# Patient Record
Sex: Male | Born: 1960 | Race: Black or African American | Hispanic: No | Marital: Married | Smoking: Current some day smoker
Health system: Southern US, Community
[De-identification: ages and names within clinical notes are randomized; demographics above are authoritative.]

## PROBLEM LIST (undated history)

## (undated) ENCOUNTER — Ambulatory Visit: Admission: EM

## (undated) DIAGNOSIS — E663 Overweight: Secondary | ICD-10-CM

## (undated) DIAGNOSIS — E119 Type 2 diabetes mellitus without complications: Secondary | ICD-10-CM

## (undated) DIAGNOSIS — S83249A Other tear of medial meniscus, current injury, unspecified knee, initial encounter: Secondary | ICD-10-CM

## (undated) DIAGNOSIS — R42 Dizziness and giddiness: Secondary | ICD-10-CM

## (undated) DIAGNOSIS — G4730 Sleep apnea, unspecified: Secondary | ICD-10-CM

## (undated) DIAGNOSIS — I10 Essential (primary) hypertension: Secondary | ICD-10-CM

## (undated) DIAGNOSIS — E785 Hyperlipidemia, unspecified: Secondary | ICD-10-CM

## (undated) DIAGNOSIS — M109 Gout, unspecified: Secondary | ICD-10-CM

## (undated) DIAGNOSIS — Z9189 Other specified personal risk factors, not elsewhere classified: Secondary | ICD-10-CM

## (undated) HISTORY — DX: Essential (primary) hypertension: I10

## (undated) HISTORY — DX: Other tear of medial meniscus, current injury, unspecified knee, initial encounter: S83.249A

## (undated) HISTORY — PX: COLONOSCOPY: SHX174

## (undated) HISTORY — DX: Overweight: E66.3

## (undated) HISTORY — DX: Type 2 diabetes mellitus without complications: E11.9

## (undated) HISTORY — DX: Hyperlipidemia, unspecified: E78.5

## (undated) HISTORY — DX: Other specified personal risk factors, not elsewhere classified: Z91.89

## (undated) HISTORY — DX: Sleep apnea, unspecified: G47.30

## (undated) HISTORY — DX: Dizziness and giddiness: R42

## (undated) HISTORY — DX: Gout, unspecified: M10.9

---

## 2006-04-23 ENCOUNTER — Ambulatory Visit: Payer: Self-pay | Admitting: General Practice

## 2008-06-24 ENCOUNTER — Ambulatory Visit: Payer: Self-pay | Admitting: General Practice

## 2008-07-08 ENCOUNTER — Ambulatory Visit: Payer: Self-pay | Admitting: General Practice

## 2010-03-28 ENCOUNTER — Ambulatory Visit: Payer: Self-pay | Admitting: Unknown Physician Specialty

## 2010-03-28 LAB — HM COLONOSCOPY

## 2012-06-06 ENCOUNTER — Ambulatory Visit: Payer: Self-pay | Admitting: General Practice

## 2012-06-09 ENCOUNTER — Ambulatory Visit: Payer: Self-pay | Admitting: General Practice

## 2016-10-24 LAB — PSA: PSA: 3.1

## 2016-10-24 LAB — LIPID PANEL
Cholesterol: 183 (ref 0–200)
HDL: 37 (ref 35–70)
LDL Cholesterol: 129
Triglycerides: 84 (ref 40–160)

## 2016-10-24 LAB — HEMOGLOBIN A1C: Hemoglobin A1C: 6.8

## 2017-08-01 LAB — LIPID PANEL
Cholesterol: 188 (ref 0–200)
HDL: 38 (ref 35–70)
LDL Cholesterol: 117
Triglycerides: 165 — AB (ref 40–160)

## 2017-08-01 LAB — HEMOGLOBIN A1C: Hemoglobin A1C: 7.3

## 2017-11-05 LAB — LIPID PANEL
Cholesterol: 188 (ref 0–200)
HDL: 36 (ref 35–70)
LDL Cholesterol: 122
Triglycerides: 150 (ref 40–160)

## 2017-11-05 LAB — PSA: PSA: 3.2

## 2017-11-05 LAB — HEMOGLOBIN A1C: Hemoglobin A1C: 7.5

## 2018-07-01 LAB — TSH
TSH: 1.22
TSH: 2.05

## 2018-07-01 LAB — T4
T4, Total: 4.1
T4, Total: 4.1

## 2018-07-01 LAB — T3 UPTAKE
T3 Uptake: 28
T3 Uptake: 30

## 2018-07-01 LAB — FREE THYROXINE INDEX
Free Thyroxine Index: 1.1
Free Thyroxine Index: 1.2

## 2018-07-02 ENCOUNTER — Ambulatory Visit: Payer: Self-pay | Admitting: Internal Medicine

## 2018-07-02 ENCOUNTER — Encounter: Payer: Self-pay | Admitting: Internal Medicine

## 2018-07-02 ENCOUNTER — Other Ambulatory Visit: Payer: Self-pay

## 2018-07-02 ENCOUNTER — Other Ambulatory Visit: Payer: Self-pay | Admitting: Hematology

## 2018-07-02 VITALS — BP 126/97 | HR 76 | Temp 97.8°F | Resp 16 | Ht 68.0 in | Wt 179.0 lb

## 2018-07-02 DIAGNOSIS — E663 Overweight: Secondary | ICD-10-CM

## 2018-07-02 DIAGNOSIS — N182 Chronic kidney disease, stage 2 (mild): Secondary | ICD-10-CM

## 2018-07-02 DIAGNOSIS — I10 Essential (primary) hypertension: Secondary | ICD-10-CM

## 2018-07-02 DIAGNOSIS — K4090 Unilateral inguinal hernia, without obstruction or gangrene, not specified as recurrent: Secondary | ICD-10-CM

## 2018-07-02 DIAGNOSIS — E119 Type 2 diabetes mellitus without complications: Secondary | ICD-10-CM

## 2018-07-02 NOTE — Progress Notes (Signed)
S - Patient with a h/o NIDDM who presents with swelling in his left groin area. Was moving furniture and lifting some the day prior to him noticing this swelling, not painful and no pain or swelling into his left testicle. He notes when he has to urinate, it feels more swollen and then after, seems to lessen. No pain with urination. No h/o hernia Has been feeling well otherwise with no complaints, continues to watch his diet and has been losing weight successfully, waist down from 36 to 33. Losing weight intentionally. Taking the medicines regularly On checks at home of his BP they have been in the 120's over mid to high 80's. tob use - occas cigar   Current Outpatient Medications on File Prior to Visit  Medication Sig Dispense Refill  . allopurinol (ZYLOPRIM) 100 MG tablet Take 300 mg by mouth daily.    Marland Kitchen amLODipine (NORVASC) 10 MG tablet Take 1 tablet by mouth daily.    Marland Kitchen atorvastatin (LIPITOR) 10 MG tablet Take 1 tablet by mouth daily.    . bisoprolol (ZEBETA) 10 MG tablet Take 10 mg by mouth daily.    . metFORMIN (GLUCOPHAGE-XR) 500 MG 24 hr tablet Take 1 tablet by mouth daily.     No current facility-administered medications on file prior to visit.      No Known Allergies   O - NAD, masked  BP (!) 126/97 (BP Location: Right Arm, Patient Position: Sitting, Cuff Size: Large)   Pulse 76   Temp 97.8 F (36.6 C) (Oral)   Resp 16   Ht 5\' 8"  (1.727 m)   Wt 179 lb (81.2 kg)   SpO2 97%   BMI 27.22 kg/m    Recheck BP with machine - 121/91 on the left  HEENT - sclera anicteric, + glasses Car - RRR  Abd - obese, NT in lower quadrants,  Left suprapubic region with mild swelling, NT to palpate GU - mild inguinal hernia on checking canal and no tenderness palpating the left testicle, right canal without gap and no right suprapubic sweliling Neuro - Affect not flat, approp with conversation  Last labs reviewed, mild CKD noted from 10/2017 results and last A1c was down to 6.9 in March,  2020.   A/P  1. Likely Inguinal hernia concern - no pain noted, and educated on hernias  Will schedule an U/S to help further evaluate and f/u after  If has more swelling and at any time sever pain in this region, needs to be seen emergently and explained why and he understood.   2. NIDDM - controlled, expect with weight decrease, better control   Continue current medication and recheck labs (A1C and BMP planned before f/u). May need to decrease metformin pending recheck of labs  3. Mild CKD - last creatine 1.31  Will recheck BMP   4. Overweight - losing weight successfully in recent past and BMI down to 27.22  Congratulated on success with losing weight, would like to check his A1C again and also a BMP and can schedule before f/u planned after his ultrasound is done.   5. HTN - BP up some today, has been ok at home on recent checks noted  Continue to monitor and with his meds presently to control.  Will get another check on f/u after his ultrasound as well and review his home BP checks at that time

## 2018-07-10 ENCOUNTER — Other Ambulatory Visit: Payer: Self-pay

## 2018-07-10 ENCOUNTER — Ambulatory Visit
Admission: RE | Admit: 2018-07-10 | Discharge: 2018-07-10 | Disposition: A | Discharge status: Home / Self Care | Payer: BC Managed Care – PPO | Source: Ambulatory Visit | Attending: Internal Medicine | Admitting: Internal Medicine

## 2018-07-10 ENCOUNTER — Other Ambulatory Visit: Payer: Managed Care, Other (non HMO)

## 2018-07-10 DIAGNOSIS — K4090 Unilateral inguinal hernia, without obstruction or gangrene, not specified as recurrent: Secondary | ICD-10-CM

## 2018-07-10 NOTE — Addendum Note (Signed)
Addended by: Lebron Conners D on: 07/10/2018 04:36 PM   Modules accepted: Orders

## 2018-07-10 NOTE — Progress Notes (Signed)
Pt would like Mayer Masker, MD for the consult

## 2018-07-11 LAB — BASIC METABOLIC PANEL
BUN/Creatinine Ratio: 15 (ref 9–20)
BUN: 16 mg/dL (ref 6–24)
CO2: 20 mmol/L (ref 20–29)
Calcium: 8.8 mg/dL (ref 8.7–10.2)
Chloride: 107 mmol/L — ABNORMAL HIGH (ref 96–106)
Creatinine, Ser: 1.06 mg/dL (ref 0.76–1.27)
GFR calc Af Amer: 89 mL/min/{1.73_m2} (ref >59)
GFR calc non Af Amer: 77 mL/min/{1.73_m2} (ref >59)
Glucose: 95 mg/dL (ref 65–99)
Potassium: 3.8 mmol/L (ref 3.5–5.2)
Sodium: 142 mmol/L (ref 134–144)

## 2018-07-11 LAB — HGB A1C W/O EAG: Hgb A1c MFr Bld: 5.7 % — ABNORMAL HIGH (ref 4.8–5.6)

## 2018-07-15 ENCOUNTER — Telehealth: Payer: Self-pay

## 2018-07-15 ENCOUNTER — Encounter: Payer: Self-pay | Admitting: Internal Medicine

## 2018-07-15 ENCOUNTER — Other Ambulatory Visit: Payer: Self-pay

## 2018-07-15 ENCOUNTER — Ambulatory Visit: Payer: Managed Care, Other (non HMO) | Admitting: Internal Medicine

## 2018-07-15 VITALS — BP 118/82 | HR 71 | Temp 96.9°F | Resp 14 | Ht 68.0 in | Wt 174.0 lb

## 2018-07-15 DIAGNOSIS — E119 Type 2 diabetes mellitus without complications: Secondary | ICD-10-CM

## 2018-07-15 DIAGNOSIS — I10 Essential (primary) hypertension: Secondary | ICD-10-CM

## 2018-07-15 DIAGNOSIS — E663 Overweight: Secondary | ICD-10-CM

## 2018-07-15 DIAGNOSIS — K4090 Unilateral inguinal hernia, without obstruction or gangrene, not specified as recurrent: Secondary | ICD-10-CM

## 2018-07-15 NOTE — Telephone Encounter (Signed)
Called to advise pt of appt with Dr Bary Castilla at 10:30am 07/16/2018, pt given contact information for clinic and advised to call once he gets there do not go into clinic without calling.

## 2018-07-15 NOTE — Progress Notes (Signed)
S - Patient with a h/o NIDDM, HTN  who presents for f/u after I saw the end of June for swelling in his left groin area with an U/S obtained and patient was called with that result and a surgical referral ordered due to concern with bowel loop involvement with the hernia. Still not painful and no pain or swelling into his left testicle. No pain with urination.   Has been feeling great, continues to watch his diet and has been losing weight successfully, waist down from 36 to 33 noted last visit. Losing weight intentionally. Taking the medicines regularly On checks at home of his BP they have been in the 120's over mid to high 80's. tob use - occas cigar  Current Outpatient Medications on File Prior to Visit  Medication Sig Dispense Refill  . allopurinol (ZYLOPRIM) 100 MG tablet Take 300 mg by mouth daily.    Marland Kitchen amLODipine (NORVASC) 10 MG tablet Take 1 tablet by mouth daily.    Marland Kitchen atorvastatin (LIPITOR) 10 MG tablet Take 1 tablet by mouth daily.    . bisoprolol (ZEBETA) 10 MG tablet Take 10 mg by mouth daily.    . metFORMIN (GLUCOPHAGE-XR) 500 MG 24 hr tablet Take 1 tablet by mouth daily.     No current facility-administered medications on file prior to visit.    No Known Allergies  O - NAD, masked  BP 118/82 (BP Location: Right Arm, Patient Position: Sitting, Cuff Size: Large)   Pulse 71   Temp (!) 96.9 F (36.1 C) (Oral)   Resp 14   Ht 5\' 8"  (1.727 m)   Wt 174 lb (78.9 kg)   SpO2 99%   BMI 26.46 kg/m   (BP 126/97, recheck 91 diastolic last visit)   HEENT - sclera anicteric, + glasses Car - RRR  Pulm - CTA  Ext - no edema Neuro - Affect not flat, approp with conversation  Last labs reviewed, A1C - 5.7 (decreased from prior), Cr normal (higher in past)  A/P  1. Inguinal hernia concern  Referral to surgery and await input, and again educated today in office    2. NIDDM - controlled, expected with weight decrease, better control and labs confirm that  Continue  current medication and recheck labs again in 3 months planned (A1C). May need to decrease/stop metformin pending recheck of labs noted.  3. Mild CKD - creatine normal on recent check (prior creatine 1.31)  Continue to monitor and emphasized staying well hydrated presently  4. Overweight - losing weight successfully in recent past and BMI down to 26.46  Congratulated on success with losing weight, and continue with diet and exercise increase as doing. He notes feels great.  5. HTN - BP better today, has been ok at home on recent checks noted  Continue to monitor and with his meds presently to control.   F/u in 3 months, sooner prn

## 2018-07-16 ENCOUNTER — Encounter: Payer: Self-pay | Admitting: General Surgery

## 2018-07-16 ENCOUNTER — Other Ambulatory Visit: Payer: Self-pay | Admitting: General Surgery

## 2018-07-16 ENCOUNTER — Ambulatory Visit (INDEPENDENT_AMBULATORY_CARE_PROVIDER_SITE_OTHER): Payer: 59 | Admitting: General Surgery

## 2018-07-16 ENCOUNTER — Other Ambulatory Visit: Payer: Self-pay

## 2018-07-16 VITALS — BP 124/81 | HR 74 | Temp 97.7°F | Ht 68.0 in | Wt 179.0 lb

## 2018-07-16 DIAGNOSIS — K4090 Unilateral inguinal hernia, without obstruction or gangrene, not specified as recurrent: Secondary | ICD-10-CM

## 2018-07-16 NOTE — Patient Instructions (Addendum)
Patient's surgery to be scheduled for 07/24/27 at [facility] with Dr. Bary Castilla.   The patient is aware to have COVID-19 testing done on 07/19/18 at the [facility] drive thru ([address] [place]) between 8:00 am and 10:30 am. He is aware to isolate after, have no visitors, wash hands frequently, and avoid touching face.   The patient is aware he will be contacted by the [facility] to complete a phone interview sometime in the near future.  The patient is aware to call the day before surgery on 07/23/18 to get his arrival time.  Patient aware to be NPO after midnight and have a driver.   He is aware to check in at the [facility] entrance where he/she will be screened for the coronavirus and then sent to Same Day Surgery.   Patient aware that he may have no visitors and driver will need to wait in the car due to COVID-19 restrictions.   The patient verbalizes understanding of the above.   The patient is aware to call the office should he have further questions.      Inguinal Hernia, Adult An inguinal hernia is when fat or your intestines push through a weak spot in a muscle where your leg meets your lower belly (groin). This causes a rounded lump (bulge). This kind of hernia could also be:  In your scrotum, if you are male.  In folds of skin around your vagina, if you are male. There are three types of inguinal hernias. These include:  Hernias that can be pushed back into the belly (are reducible). This type rarely causes pain.  Hernias that cannot be pushed back into the belly (are incarcerated).  Hernias that cannot be pushed back into the belly and lose their blood supply (are strangulated). This type needs emergency surgery. If you do not have symptoms, you may not need treatment. If you have symptoms or a large hernia, you may need surgery. Follow these instructions at home: Lifestyle  Do these things if told by your doctor so you do not  have trouble pooping (constipation): ? Drink enough fluid to keep your pee (urine) pale yellow. ? Eat foods that have a lot of fiber. These include fresh fruits and vegetables, whole grains, and beans. ? Limit foods that are high in fat and processed sugars. These include foods that are fried or sweet. ? Take medicine for trouble pooping.  Avoid lifting heavy objects.  Avoid standing for long amounts of time.  Do not use any products that contain nicotine or tobacco. These include cigarettes and e-cigarettes. If you need help quitting, ask your doctor.  Stay at a healthy weight. General instructions  You may try to push your hernia in by very gently pressing on it when you are lying down. Do not try to force the bulge back in if it will not push in easily.  Watch your hernia for any changes in shape, size, or color. Tell your doctor if you see any changes.  Take over-the-counter and prescription medicines only as told by your doctor.  Keep all follow-up visits as told by your doctor. This is important. Contact a doctor if:  You have a fever.  You have new symptoms.  Your symptoms get worse. Get help right away if:  The area where your leg meets your lower belly has: ? Pain that gets worse suddenly. ? A bulge that gets bigger suddenly, and it does not get smaller after that. ?  A bulge that turns red or purple. ? A bulge that is painful when you touch it.  You are a man, and your scrotum: ? Suddenly feels painful. ? Suddenly changes in size.  You cannot push the hernia in by very gently pressing on it when you are lying down. Do not try to force the bulge back in if it will not push in easily.  You feel sick to your stomach (nauseous), and that feeling does not go away.  You throw up (vomit), and that keeps happening.  You have a fast heartbeat.  You cannot poop (have a bowel movement) or pass gas. These symptoms may be an emergency. Do not wait to see if the symptoms  will go away. Get medical help right away. Call your local emergency services (911 in the [place] Summary  An inguinal hernia is when fat or your intestines push through a weak spot in a muscle where your leg meets your lower belly (groin). This causes a rounded lump (bulge).  If you do not have symptoms, you may not need treatment. If you have symptoms or a large hernia, you may need surgery.  Avoid lifting heavy objects. Also avoid standing for long amounts of time.  Do not try to force the bulge back in if it will not push in easily. This information is not intended to replace advice given to you by your health care provider. Make sure you discuss any questions you have with your health care provider. Document Released: 01/26/2006 Document Revised: 01/27/2017 Document Reviewed: 09/27/2016 Elsevier Patient Education  2020 [employer].

## 2018-07-16 NOTE — Progress Notes (Signed)
Patient ID: Tom Edwards, male   DOB: Nov 15, 1960, 58 y.o.   MRN: [id number]  Chief Complaint  Patient presents with  . Hernia     left inguinal hernia    HPI Tom Edwards is a 58 y.o. male.  Here for evaluation of left inguinal hernia. He states he noticed some left groin swelling about 2-3 weeks ago. Denies pain. No GI issues. He states he helped his brother lift some furniture about one month ago. The patient is scheduled to be married later this summer assuming a suitable venue can be identified during the time of the pandemic.  HPI  Past Medical History:  Diagnosis Date  . Acute medial meniscus tear   . DM (diabetes mellitus), type 2 ([place])   . Gout   . Hyperlipemia   . Hypertension   . Over weight   . Sedentary lifestyle   . Sleep apnea   . Vertigo     No past surgical history on file.  Family History  Problem Relation Age of Onset  . Diabetes Mother   . Cerebrovascular Accident Mother   . Prostate cancer Father   . Hypertension Father   . Diabetes Brother     Social History Social History   Tobacco Use  . Smoking status: Current Some Day Smoker    Types: Cigars  . Smokeless tobacco: Never Used  Substance Use Topics  . Alcohol use: Yes    Alcohol/week: 7.0 standard drinks    Types: 7 Cans of beer per week  . Drug use: Never    No Known Allergies  Current Outpatient Medications  Medication Sig Dispense Refill  . allopurinol (ZYLOPRIM) 100 MG tablet Take 300 mg by mouth daily.    Marland Kitchen amLODipine (NORVASC) 10 MG tablet Take 1 tablet by mouth daily.    Marland Kitchen atorvastatin (LIPITOR) 10 MG tablet Take 1 tablet by mouth daily.    . bisoprolol (ZEBETA) 10 MG tablet Take 10 mg by mouth daily.    . metFORMIN (GLUCOPHAGE-XR) 500 MG 24 hr tablet Take 1 tablet by mouth daily.     No current facility-administered medications for this visit.     Review of Systems Review of Systems  Constitutional: Negative.   Respiratory: Negative.   Cardiovascular: Negative.      Blood pressure 124/81, pulse 74, temperature 97.7 F (36.5 C), temperature source Temporal, height 5\' 8"  (1.727 m), weight 179 lb (81.2 kg), SpO2 97 %.  Physical Exam Physical Exam Constitutional:      Appearance: He is well-developed.  HENT:     Mouth/Throat:     Pharynx: No oropharyngeal exudate.  Eyes:     General: No scleral icterus.    Conjunctiva/sclera: Conjunctivae normal.  Neck:     Musculoskeletal: Neck supple.  Cardiovascular:     Rate and Rhythm: Normal rate and regular rhythm.     Heart sounds: Normal heart sounds.  Pulmonary:     Effort: Pulmonary effort is normal.     Breath sounds: Normal breath sounds.  Abdominal:     General: Bowel sounds are normal.     Palpations: Abdomen is soft.     Hernia: A hernia is present. Hernia is present in the left inguinal area. There is no hernia in the right inguinal area.  Genitourinary:    Scrotum/Testes: Normal.    Skin:    General: Skin is warm and dry.  Neurological:     Mental Status: He is alert and oriented to person, place,  and time.  Psychiatric:        Behavior: Behavior normal.     Data Reviewed Laboratory studies dated July 10, 2018 showed a hemoglobin A1c of 5.7.  Improved from 7.58 months earlier.  Basic metabolic panel of the same day was unremarkable.  Creatinine of 1.06 with an estimated GFR of 89.  Chloride of 107.  Assessment Left inguinal hernia.  Plan   Hernia precautions and incarceration were discussed with the patient. If they develop symptoms of an incarcerated hernia, they were encouraged to seek prompt medical attention.  I have recommended repair of the hernia using mesh on an outpatient basis in the near future. The risk of infection was reviewed. The role of prosthetic mesh to minimize the risk of recurrence was reviewed.  The patient is aware to call back for any questions or new concerns.   The patient is aware to call back for any questions or new concerns. HPI, assessment,  plan and physical exam has been scribed under the direction and in the presence of Robert Bellow, MD. Karie Fetch, RN  I have completed the exam and reviewed the above documentation for accuracy and completeness.  I agree with the above.  Haematologist has been used and any errors in dictation or transcription are unintentional.  Hervey Ard, M.D., F.A.C.S. [facility] Gleason Falicity Sheets 07/16/2018, 8:52 PM

## 2018-07-18 ENCOUNTER — Encounter
Admission: RE | Admit: 2018-07-18 | Discharge: 2018-07-18 | Disposition: A | Discharge status: Home / Self Care | Payer: 59 | Source: Ambulatory Visit | Attending: General Surgery | Admitting: General Surgery

## 2018-07-18 ENCOUNTER — Other Ambulatory Visit: Payer: Self-pay

## 2018-07-18 NOTE — Patient Instructions (Signed)
Your procedure is scheduled on: Wed. 7/15 Report to Day Surgery. To find out your arrival time please call [phone] between 1PM - 3PM on Tues 7/14.  Remember: Instructions that are not followed completely may result in serious medical risk,  up to and including death, or upon the discretion of your surgeon and anesthesiologist your  surgery may need to be rescheduled.     _X__ 1. Do not eat food after midnight the night before your procedure.                 No gum chewing or hard candies. You may drink clear liquids up to 2 hours                 before you are scheduled to arrive for your surgery- DO not drink clear                 liquids within 2 hours of the start of your surgery.                 Clear Liquids include:  water, apple juice without pulp, clear carbohydrate                 drink such as Clearfast of Gatorade, Black Coffee or Tea (Do not add                 anything to coffee or tea).  __X__2.  On the morning of surgery brush your teeth with toothpaste and water, you                may rinse your mouth with mouthwash if you wish.  Do not swallow any toothpaste of mouthwash.     _X__ 3.  No Alcohol for 24 hours before or after surgery.   _X__ 4.  Do Not Smoke or use e-cigarettes For 24 Hours Prior to Your Surgery.                 Do not use any chewable tobacco products for at least 6 hours prior to                 surgery.  ____  5.  Bring all medications with you on the day of surgery if instructed.   __x__  6.  Notify your doctor if there is any change in your medical condition      (cold, fever, infections).     Do not wear jewelry, make-up, hairpins, clips or nail polish. Do not wear lotions, powders, or perfumes. You may wear deodorant. Do not shave 48 hours prior to surgery. Men may shave face and neck. Do not bring valuables to the hospital.    [facility] is not responsible for any belongings or valuables.  Contacts, dentures  or bridgework may not be worn into surgery. Leave your suitcase in the car. After surgery it may be brought to your room. For patients admitted to the hospital, discharge time is determined by your treatment team.   Patients discharged the day of surgery will not be allowed to drive home.   Please read over the following fact sheets that you were given:   __x__ Take these medicines the morning of surgery with A SIP OF WATER:    1. allopurinol (ZYLOPRIM) 100 MG tablet  2.bisoprolol (ZEBETA) 10 MG tablet   3.   4.  5.  6.  ____ Fleet Enema (as directed)   __x__ Use CHG Soap as directed  ____ Use inhalers on the day of surgery  __x__ Stop metformin 2 days prior to surgery Last dose on Sunday   ____ Take 1/2 of usual insulin dose the night before surgery. No insulin the morning          of surgery.   ____ Stop Coumadin/Plavix/aspirin on   __x__ Stop Anti-inflammatories No ibuprofen or Aleve or Aspirin until after surgery.  May take tylenol   ____ Stop supplements until after surgery.    ____ Bring C-Pap to the hospital.

## 2018-07-19 ENCOUNTER — Other Ambulatory Visit: Payer: Self-pay

## 2018-07-19 ENCOUNTER — Encounter
Admission: RE | Admit: 2018-07-19 | Discharge: 2018-07-19 | Disposition: A | Discharge status: Home / Self Care | Payer: 59 | Source: Ambulatory Visit | Attending: General Surgery | Admitting: General Surgery

## 2018-07-19 DIAGNOSIS — Z1159 Encounter for screening for other viral diseases: Secondary | ICD-10-CM | POA: Diagnosis not present

## 2018-07-19 DIAGNOSIS — I10 Essential (primary) hypertension: Secondary | ICD-10-CM | POA: Diagnosis not present

## 2018-07-19 DIAGNOSIS — Z01818 Encounter for other preprocedural examination: Secondary | ICD-10-CM | POA: Diagnosis present

## 2018-07-19 LAB — SARS CORONAVIRUS 2 (TAT 6-24 HRS): SARS Coronavirus 2: NEGATIVE

## 2018-07-24 ENCOUNTER — Encounter
Admission: RE | Disposition: A | Discharge status: Home / Self Care | Payer: Self-pay | Source: Home / Self Care | Attending: General Surgery

## 2018-07-24 ENCOUNTER — Ambulatory Visit: Payer: 59 | Admitting: Certified Registered"

## 2018-07-24 ENCOUNTER — Other Ambulatory Visit: Payer: Self-pay

## 2018-07-24 ENCOUNTER — Ambulatory Visit
Admission: RE | Admit: 2018-07-24 | Discharge: 2018-07-24 | Disposition: A | Discharge status: Home / Self Care | Payer: 59 | Attending: General Surgery | Admitting: General Surgery

## 2018-07-24 ENCOUNTER — Encounter: Payer: Self-pay | Admitting: *Deleted

## 2018-07-24 DIAGNOSIS — K4090 Unilateral inguinal hernia, without obstruction or gangrene, not specified as recurrent: Secondary | ICD-10-CM | POA: Diagnosis present

## 2018-07-24 DIAGNOSIS — Z6827 Body mass index (BMI) 27.0-27.9, adult: Secondary | ICD-10-CM | POA: Insufficient documentation

## 2018-07-24 DIAGNOSIS — E785 Hyperlipidemia, unspecified: Secondary | ICD-10-CM | POA: Insufficient documentation

## 2018-07-24 DIAGNOSIS — E1122 Type 2 diabetes mellitus with diabetic chronic kidney disease: Secondary | ICD-10-CM | POA: Insufficient documentation

## 2018-07-24 DIAGNOSIS — I129 Hypertensive chronic kidney disease with stage 1 through stage 4 chronic kidney disease, or unspecified chronic kidney disease: Secondary | ICD-10-CM | POA: Diagnosis not present

## 2018-07-24 DIAGNOSIS — G4730 Sleep apnea, unspecified: Secondary | ICD-10-CM | POA: Insufficient documentation

## 2018-07-24 DIAGNOSIS — E669 Obesity, unspecified: Secondary | ICD-10-CM | POA: Insufficient documentation

## 2018-07-24 DIAGNOSIS — N182 Chronic kidney disease, stage 2 (mild): Secondary | ICD-10-CM | POA: Diagnosis not present

## 2018-07-24 DIAGNOSIS — F17290 Nicotine dependence, other tobacco product, uncomplicated: Secondary | ICD-10-CM | POA: Diagnosis not present

## 2018-07-24 DIAGNOSIS — Z7984 Long term (current) use of oral hypoglycemic drugs: Secondary | ICD-10-CM | POA: Insufficient documentation

## 2018-07-24 HISTORY — PX: INGUINAL HERNIA REPAIR: SHX194

## 2018-07-24 LAB — GLUCOSE, CAPILLARY: Glucose-Capillary: 111 mg/dL — ABNORMAL HIGH (ref 70–99)

## 2018-07-24 SURGERY — REPAIR, HERNIA, INGUINAL, ADULT
Anesthesia: General | Laterality: Left

## 2018-07-24 MED ORDER — BUPIVACAINE-EPINEPHRINE (PF) 0.5% -1:200000 IJ SOLN
INTRAMUSCULAR | Status: DC | PRN
  Administered 2018-07-24: 20 mL
  Administered 2018-07-24: 10 mL

## 2018-07-24 MED ORDER — DEXAMETHASONE SODIUM PHOSPHATE 10 MG/ML IJ SOLN
INTRAMUSCULAR | Status: DC | PRN
  Administered 2018-07-24: 10 mg via INTRAVENOUS

## 2018-07-24 MED ORDER — CEFAZOLIN SODIUM-DEXTROSE 2-4 GM/100ML-% IV SOLN
2.0000 g | INTRAVENOUS | Status: AC
  Administered 2018-07-24: 2 g via INTRAVENOUS

## 2018-07-24 MED ORDER — FAMOTIDINE 20 MG PO TABS
20.0000 mg | ORAL_TABLET | Freq: Once | ORAL | Status: AC
  Administered 2018-07-24: 11:00:00 20 mg via ORAL

## 2018-07-24 MED ORDER — OXYCODONE HCL 5 MG/5ML PO SOLN: 5.0000 mg | Freq: Once | ORAL | Status: DC | PRN

## 2018-07-24 MED ORDER — LIDOCAINE HCL (CARDIAC) PF 100 MG/5ML IV SOSY
PREFILLED_SYRINGE | INTRAVENOUS | Status: DC | PRN
  Administered 2018-07-24: 100 mg via INTRAVENOUS

## 2018-07-24 MED ORDER — PHENYLEPHRINE HCL (PRESSORS) 10 MG/ML IV SOLN
INTRAVENOUS | Status: DC | PRN
  Administered 2018-07-24 (×2): 200 ug via INTRAVENOUS

## 2018-07-24 MED ORDER — CEFAZOLIN SODIUM-DEXTROSE 2-4 GM/100ML-% IV SOLN
INTRAVENOUS | Status: AC
  Filled 2018-07-24: qty 100

## 2018-07-24 MED ORDER — FENTANYL CITRATE (PF) 100 MCG/2ML IJ SOLN
INTRAMUSCULAR | Status: DC | PRN
  Administered 2018-07-24 (×2): 50 ug via INTRAVENOUS

## 2018-07-24 MED ORDER — MEPERIDINE HCL 50 MG/ML IJ SOLN: 6.2500 mg | INTRAMUSCULAR | Status: DC | PRN

## 2018-07-24 MED ORDER — OXYCODONE HCL 5 MG PO TABS: 5.0000 mg | ORAL_TABLET | Freq: Once | ORAL | Status: DC | PRN

## 2018-07-24 MED ORDER — GABAPENTIN 300 MG PO CAPS
ORAL_CAPSULE | ORAL | Status: AC
  Administered 2018-07-24: 300 mg via ORAL
  Filled 2018-07-24: qty 1

## 2018-07-24 MED ORDER — FENTANYL CITRATE (PF) 100 MCG/2ML IJ SOLN: 25.0000 ug | INTRAMUSCULAR | Status: DC | PRN

## 2018-07-24 MED ORDER — GABAPENTIN 300 MG PO CAPS
300.0000 mg | ORAL_CAPSULE | ORAL | Status: AC
  Administered 2018-07-24: 11:00:00 300 mg via ORAL

## 2018-07-24 MED ORDER — ONDANSETRON HCL 4 MG/2ML IJ SOLN
INTRAMUSCULAR | Status: DC | PRN
  Administered 2018-07-24: 4 mg via INTRAVENOUS

## 2018-07-24 MED ORDER — PROPOFOL 10 MG/ML IV BOLUS
INTRAVENOUS | Status: AC
  Filled 2018-07-24: qty 20

## 2018-07-24 MED ORDER — FENTANYL CITRATE (PF) 100 MCG/2ML IJ SOLN
INTRAMUSCULAR | Status: AC
  Filled 2018-07-24: qty 2

## 2018-07-24 MED ORDER — LACTATED RINGERS IV SOLN
INTRAVENOUS | Status: DC | PRN
  Administered 2018-07-24: 12:00:00 via INTRAVENOUS

## 2018-07-24 MED ORDER — EPHEDRINE SULFATE 50 MG/ML IJ SOLN
INTRAMUSCULAR | Status: DC | PRN
  Administered 2018-07-24 (×2): 10 mg via INTRAVENOUS

## 2018-07-24 MED ORDER — FAMOTIDINE 20 MG PO TABS
ORAL_TABLET | ORAL | Status: AC
  Administered 2018-07-24: 20 mg via ORAL
  Filled 2018-07-24: qty 1

## 2018-07-24 MED ORDER — ACETAMINOPHEN 10 MG/ML IV SOLN
INTRAVENOUS | Status: AC
  Filled 2018-07-24: qty 100

## 2018-07-24 MED ORDER — MIDAZOLAM HCL 2 MG/2ML IJ SOLN
INTRAMUSCULAR | Status: AC
  Filled 2018-07-24: qty 2

## 2018-07-24 MED ORDER — PROPOFOL 10 MG/ML IV BOLUS
INTRAVENOUS | Status: DC | PRN
  Administered 2018-07-24: 160 mg via INTRAVENOUS

## 2018-07-24 MED ORDER — SODIUM CHLORIDE 0.9 % IV SOLN
INTRAVENOUS | Status: DC
  Administered 2018-07-24: 11:00:00 via INTRAVENOUS

## 2018-07-24 MED ORDER — BUPIVACAINE-EPINEPHRINE (PF) 0.5% -1:200000 IJ SOLN
INTRAMUSCULAR | Status: AC
  Filled 2018-07-24: qty 30

## 2018-07-24 MED ORDER — PROMETHAZINE HCL 25 MG/ML IJ SOLN: 6.2500 mg | INTRAMUSCULAR | Status: DC | PRN

## 2018-07-24 MED ORDER — HYDROCODONE-ACETAMINOPHEN 5-325 MG PO TABS: 1.0000 | ORAL_TABLET | ORAL | 0 refills | Status: DC | PRN

## 2018-07-24 MED ORDER — KETOROLAC TROMETHAMINE 30 MG/ML IJ SOLN
INTRAMUSCULAR | Status: DC | PRN
  Administered 2018-07-24: 30 mg

## 2018-07-24 MED ORDER — ACETAMINOPHEN 10 MG/ML IV SOLN
INTRAVENOUS | Status: DC | PRN
  Administered 2018-07-24: 1000 mg via INTRAVENOUS

## 2018-07-24 SURGICAL SUPPLY — 34 items
BLADE SURG 15 STRL SS SAFETY (BLADE) ×4 IMPLANT
CANISTER SUCT 1200ML W/VALVE (MISCELLANEOUS) ×2 IMPLANT
CHLORAPREP W/TINT 26 (MISCELLANEOUS) ×2 IMPLANT
COVER WAND RF STERILE (DRAPES) IMPLANT
DECANTER SPIKE VIAL GLASS SM (MISCELLANEOUS) IMPLANT
DRAIN PENROSE 1/4X12 LTX (DRAIN) ×2 IMPLANT
DRAPE LAPAROTOMY 100X77 ABD (DRAPES) ×2 IMPLANT
DRSG TEGADERM 4X4.75 (GAUZE/BANDAGES/DRESSINGS) ×2 IMPLANT
DRSG TELFA 4X3 1S NADH ST (GAUZE/BANDAGES/DRESSINGS) ×2 IMPLANT
ELECT REM PT RETURN 9FT ADLT (ELECTROSURGICAL) ×2
ELECTRODE REM PT RTRN 9FT ADLT (ELECTROSURGICAL) ×1 IMPLANT
GLOVE BIO SURGEON STRL SZ7.5 (GLOVE) ×6 IMPLANT
GLOVE INDICATOR 8.0 STRL GRN (GLOVE) ×6 IMPLANT
GOWN STRL REUS W/ TWL LRG LVL3 (GOWN DISPOSABLE) ×3 IMPLANT
GOWN STRL REUS W/TWL LRG LVL3 (GOWN DISPOSABLE) ×3
KIT TURNOVER KIT A (KITS) ×2 IMPLANT
LABEL OR SOLS (LABEL) IMPLANT
MESH HERNIA SYS ULTRAPRO LRG (Mesh General) ×2 IMPLANT
NEEDLE HYPO 22GX1.5 SAFETY (NEEDLE) ×4 IMPLANT
PACK BASIN MINOR ARMC (MISCELLANEOUS) ×2 IMPLANT
STRIP CLOSURE SKIN 1/2X4 (GAUZE/BANDAGES/DRESSINGS) IMPLANT
SUT PDS AB 0 CT1 27 (SUTURE) IMPLANT
SUT SURGILON 0 BLK (SUTURE) ×2 IMPLANT
SUT VIC AB 2-0 SH 27 (SUTURE) ×1
SUT VIC AB 2-0 SH 27XBRD (SUTURE) ×1 IMPLANT
SUT VIC AB 3-0 54X BRD REEL (SUTURE) IMPLANT
SUT VIC AB 3-0 BRD 54 (SUTURE)
SUT VIC AB 3-0 SH 27 (SUTURE) ×1
SUT VIC AB 3-0 SH 27X BRD (SUTURE) ×1 IMPLANT
SUT VIC AB 4-0 FS2 27 (SUTURE) ×2 IMPLANT
SWABSTK COMLB BENZOIN TINCTURE (MISCELLANEOUS) ×2 IMPLANT
SYR 10ML LL (SYRINGE) ×2 IMPLANT
SYR 3ML LL SCALE MARK (SYRINGE) ×2 IMPLANT
TAPE STRIPS DRAPE STRL (GAUZE/BANDAGES/DRESSINGS) ×2 IMPLANT

## 2018-07-24 NOTE — Anesthesia Procedure Notes (Signed)
Procedure Name: LMA Insertion Date/Time: 07/24/2018 11:51 AM Performed by: Philbert Riser, CRNA Pre-anesthesia Checklist: Patient identified, Emergency Drugs available, Suction available, Patient being monitored and Timeout performed Patient Re-evaluated:Patient Re-evaluated prior to induction Oxygen Delivery Method: Circle system utilized and Simple face mask Preoxygenation: Pre-oxygenation with 100% oxygen Induction Type: IV induction Ventilation: Mask ventilation without difficulty LMA: LMA inserted LMA Size: 4.0 Dental Injury: Teeth and Oropharynx as per pre-operative assessment

## 2018-07-24 NOTE — H&P (Signed)
No change in clinical history or exam.  For a left inguinal hernia repair.

## 2018-07-24 NOTE — Discharge Instructions (Signed)
AMBULATORY SURGERY  °DISCHARGE INSTRUCTIONS ° ° °1) The drugs that you were given will stay in your system until tomorrow so for the next 24 hours you should not: ° °A) Drive an automobile °B) Make any legal decisions °C) Drink any alcoholic beverage ° ° °2) You may resume regular meals tomorrow.  Today it is better to start with liquids and gradually work up to solid foods. ° °You may eat anything you prefer, but it is better to start with liquids, then soup and crackers, and gradually work up to solid foods. ° ° °3) Please notify your doctor immediately if you have any unusual bleeding, trouble breathing, redness and pain at the surgery site, drainage, fever, or pain not relieved by medication. ° ° ° °4) Additional Instructions: ° ° ° ° ° ° ° °Please contact your physician with any problems or Same Day Surgery at [phone], Monday through Friday 6 am to 4 pm, or [place] at [place] Main number at [phone]. °

## 2018-07-24 NOTE — Anesthesia Preprocedure Evaluation (Signed)
Anesthesia Evaluation  Patient identified by MRN, date of birth, ID band Patient awake    Reviewed: Allergy & Precautions, NPO status , Patient's Chart, lab work & pertinent test results  History of Anesthesia Complications Negative for: history of anesthetic complications  Airway Mallampati: II  TM Distance: >3 FB Neck ROM: Full    Dental no notable dental hx.    Pulmonary Sleep apnea: has not needed CPAP since losing weight. , neg COPD, Current Smoker (occasional cigars),    breath sounds clear to auscultation- rhonchi (-) wheezing      Cardiovascular hypertension, Pt. on medications (-) CAD, (-) Past MI, (-) Cardiac Stents and (-) CABG  Rhythm:Regular Rate:Normal - Systolic murmurs and - Diastolic murmurs    Neuro/Psych neg Seizures negative neurological ROS  negative psych ROS   GI/Hepatic negative GI ROS, Neg liver ROS,   Endo/Other  diabetes (diet controlled)  Renal/GU negative Renal ROS     Musculoskeletal negative musculoskeletal ROS (+)   Abdominal (+) - obese,   Peds  Hematology negative hematology ROS (+)   Anesthesia Other Findings Past Medical History: No date: Acute medial meniscus tear No date: DM (diabetes mellitus), type 2 ([facility]) No date: Gout No date: Hyperlipemia No date: Hypertension No date: Over weight No date: Sedentary lifestyle No date: Sleep apnea     Comment:  no cpap No date: Vertigo No date: Vertigo   Reproductive/Obstetrics                             Anesthesia Physical Anesthesia Plan  ASA: II  Anesthesia Plan: General   Post-op Pain Management:    Induction: Intravenous  PONV Risk Score and Plan: 0 and Ondansetron and Dexamethasone  Airway Management Planned: LMA  Additional Equipment:   Intra-op Plan:   Post-operative Plan:   Informed Consent: I have reviewed the patients History and Physical, chart, labs and discussed the procedure  including the risks, benefits and alternatives for the proposed anesthesia with the patient or authorized representative who has indicated his/her understanding and acceptance.     Dental advisory given  Plan Discussed with: CRNA and Anesthesiologist  Anesthesia Plan Comments:         Anesthesia Quick Evaluation

## 2018-07-24 NOTE — Anesthesia Postprocedure Evaluation (Signed)
Anesthesia Post Note  Patient: Tom Edwards  Procedure(s) Performed: HERNIA REPAIR INGUINAL ADULT, LEFT OPEN (Left )  Patient location during evaluation: PACU Anesthesia Type: General Level of consciousness: awake and alert and oriented Pain management: pain level controlled Vital Signs Assessment: post-procedure vital signs reviewed and stable Respiratory status: spontaneous breathing, nonlabored ventilation and respiratory function stable Cardiovascular status: blood pressure returned to baseline and stable Postop Assessment: no signs of nausea or vomiting Anesthetic complications: no     Last Vitals:  Vitals:   07/24/18 1334 07/24/18 1342  BP: 115/71 119/80  Pulse: 73 60  Resp: 16 16  Temp: (!) 36.3 C 36.4 C  SpO2: 98% 99%    Last Pain:  Vitals:   07/24/18 1342  TempSrc: Temporal  PainSc: 4                  Latesha Chesney

## 2018-07-24 NOTE — Transfer of Care (Signed)
Immediate Anesthesia Transfer of Care Note  Patient: Tom Edwards  Procedure(s) Performed: HERNIA REPAIR INGUINAL ADULT, LEFT OPEN (Left )  Patient Location: PACU  Anesthesia Type:General  Level of Consciousness: awake and alert   Airway & Oxygen Therapy: Patient Spontanous Breathing and Patient connected to face mask oxygen  Post-op Assessment: Report given to RN and Post -op Vital signs reviewed and stable  Post vital signs: Reviewed and stable  Last Vitals:  Vitals Value Taken Time  BP 106/80 07/24/18 1304  Temp 36.3 C 07/24/18 1304  Pulse 77 07/24/18 1314  Resp 21 07/24/18 1308  SpO2 99 % 07/24/18 1314  Vitals shown include unvalidated device data.  Last Pain:  Vitals:   07/24/18 1304  TempSrc:   PainSc: 0-No pain         Complications: No apparent anesthesia complications

## 2018-07-24 NOTE — Anesthesia Post-op Follow-up Note (Signed)
Anesthesia QCDR form completed.        

## 2018-07-24 NOTE — Op Note (Signed)
Preoperative diagnosis: Left inguinal hernia.  Postoperative diagnosis: Same.  Operative procedure: Left indirect inguinal hernia repair with large Ultra Pro mesh.  Operating Surgeon: Hervey Ard, MD.  Anesthesia: General by LMA, Marcaine 0.5% with 1-200,000's of epinephrine, 30 cc.  Toradol: 30 mg  Estimated blood loss: Less than 5 cc.  Clinical note: This 58 year old male was helping a relative move when he noticed pain and swelling in the left groin.  Examination preoperatively showed a left inguinal hernia extending down in the upper aspect of the scrotum.  He is admitted for elective repair.  Hair was removed the surgical site prior to presentation to the operating theater.  SCD stockings for DVT prevention.  The patient received Ancef prior to the procedure.  Operative note: With the patient under adequate general anesthesia the area was cleansed with ChloraPrep and draped.  Field block anesthesia was established and a 5 cm skin line incision along the anticipated course the inguinal canal was carried out the skin subtendinous tissue with hemostasis achieved by electrocautery and 3-0 Vicryl ties.  The external Bleich was opened the direction of its fibers and the cremasteric muscle divided with cautery.  The patient had a sizable sac as well as a lipoma of the cord.  The latter was excised and discarded.  The hernia sac was dissected down to the top of the scrotum.  This was then dissected back in the preperitoneal space where there was marked widening of the internal ring.  The inferior epigastric vessels were mobilized to provide better exposure of the preperitoneal space.  A large ultra Pro mesh was smoothed into position and the external component laid along the floor the inguinal canal.  A lateral slit was made for cord passage.  The mesh was anchored to the pubic tubercle with 0 Surgilon.  The inferior aspect of the mesh was anchored to the inguinal ligament with interrupted 0 Surgilon  sutures.  The medial and superior borders were anchored to the transverse abdominis aponeurosis with similar sutures.  The ilioinguinal and iliohypogastric nerves were both identified and protected through the procedure.  Toradol was placed in the wound.  The external oblique was closed with a running three 2-0 Vicryl suture.  Scarpa's fascia was approximated with a running 3-0 Vicryl suture.  The skin was closed with a running 4-0 Vicryl subcuticular suture.  Benzoin, Steri-Strips, Telfa and Tegaderm dressing was applied.  The patient tolerated the procedure well and was taken recovery in stable condition.

## 2018-07-25 ENCOUNTER — Encounter: Payer: Self-pay | Admitting: General Surgery

## 2018-07-29 ENCOUNTER — Encounter: Payer: Self-pay | Admitting: General Surgery

## 2018-08-01 ENCOUNTER — Telehealth: Payer: Self-pay | Admitting: *Deleted

## 2018-08-01 NOTE — Telephone Encounter (Signed)
Patient states that he still has some swelling in the surgical area. No testicular swelling noted. No increased soreness, drainage, or redness. He is using the bathroom without difficulty. Patient notified that this is normal and will go down in about a week. He may continue to use heat to the area but if the swelling is bothersome he may use ice instead. He will call if he has any further concerns.

## 2018-08-01 NOTE — Telephone Encounter (Signed)
Patient called and stated that he is having some pain from the surgery. He also took the bandages off this morning and the swelling has not went down. Please call and advise

## 2018-08-07 ENCOUNTER — Other Ambulatory Visit: Payer: Self-pay

## 2018-08-07 ENCOUNTER — Ambulatory Visit (INDEPENDENT_AMBULATORY_CARE_PROVIDER_SITE_OTHER): Payer: 59 | Admitting: General Surgery

## 2018-08-07 ENCOUNTER — Encounter: Payer: Self-pay | Admitting: General Surgery

## 2018-08-07 VITALS — BP 167/95 | HR 74 | Temp 97.7°F | Ht 68.0 in | Wt 177.0 lb

## 2018-08-07 DIAGNOSIS — Z9889 Other specified postprocedural states: Secondary | ICD-10-CM

## 2018-08-07 DIAGNOSIS — Z8719 Personal history of other diseases of the digestive system: Secondary | ICD-10-CM

## 2018-08-07 NOTE — Patient Instructions (Signed)
Return as needed.The patient is aware to call back for any questions or concerns.  

## 2018-08-07 NOTE — Progress Notes (Signed)
Terrance Usery is here today for a postoperative visit.  He underwent left inguinal hernia repair on July 24, 2018.  This was performed by Dr. Bary Castilla.  As Dr. Tollie Pizza is no longer practicing in our group, Mr. Spark is scheduled to see me for his postop visit.    He states that he has done well since his operation.  He denies any fevers or chills.  His pain control has been adequate.  He is moving his bowels and urinating without difficulty.  Vitals:   08/07/18 1026  BP: (!) 167/95  Pulse: 74  Temp: 97.7 F (36.5 C)  SpO2: 98%   Focused exam: There is a well approximated left inguinal incision.  There is a healing ridge present.  There is a normal and expected amount of postoperative swelling present.  There is no erythema, induration, or drainage.  Impression and plan: Taje Littler is a 57 year old man who underwent a left inguinal hernia repair with mesh.  He is doing well from his operation.  He may resume all of his usual activities, with the exception of lifting anything heavier than 10 pounds for another 4 weeks.  He was concerned about the thickness of the scar; I reassured him that it was normal and expected at this point after his operation.  I also suggested that he might consider massaging the scar with an emollient agent to help break down any hypertrophic scar and soften and smoothe the area.  We will see him on an as-needed basis.

## 2018-08-08 ENCOUNTER — Encounter: Payer: 59 | Admitting: General Surgery

## 2018-09-02 ENCOUNTER — Other Ambulatory Visit: Payer: Self-pay

## 2018-09-02 ENCOUNTER — Encounter: Payer: Self-pay | Admitting: Internal Medicine

## 2018-09-02 MED ORDER — METFORMIN HCL ER 500 MG PO TB24: 500.0000 mg | ORAL_TABLET | Freq: Every day | ORAL | 0 refills | Status: DC

## 2018-09-02 NOTE — Telephone Encounter (Signed)
Labs 07/2018 and office visit 07/15/2018 Dr Roxan Hockey stated if HgbA1c continues low may adjust metformin dosing at next OV.   HgbA1c 5.7 and due next visit Oct 2020.  Bridge refill 90 days electronic Rx sent to patient's pharmacy of choice metformin XR 500mg  po with breakfast #90 [id number]

## 2018-10-15 ENCOUNTER — Ambulatory Visit: Payer: Managed Care, Other (non HMO) | Admitting: Occupational Medicine

## 2018-10-15 ENCOUNTER — Other Ambulatory Visit: Payer: Self-pay

## 2018-10-15 ENCOUNTER — Encounter: Payer: Self-pay | Admitting: Occupational Medicine

## 2018-10-15 VITALS — BP 125/91 | HR 70 | Temp 97.3°F | Resp 12 | Ht 68.0 in | Wt 180.0 lb

## 2018-10-15 DIAGNOSIS — R7309 Other abnormal glucose: Secondary | ICD-10-CM

## 2018-10-15 LAB — POCT URINALYSIS DIPSTICK
Bilirubin, UA: NEGATIVE
Blood, UA: NEGATIVE
Glucose, UA: NEGATIVE
Ketones, UA: NEGATIVE
Leukocytes, UA: NEGATIVE
Nitrite, UA: NEGATIVE
Protein, UA: NEGATIVE
Spec Grav, UA: 1.015 (ref 1.010–1.025)
Urobilinogen, UA: 0.2 E.U./dL
pH, UA: 6 (ref 5.0–8.0)

## 2018-10-15 NOTE — Progress Notes (Signed)
A1c Now = 5.1

## 2018-10-17 NOTE — Progress Notes (Signed)
Documented today 10/18/2018

## 2018-10-18 NOTE — Progress Notes (Signed)
  Subjective:     Patient ID: Tom Edwards, male   DOB: 1960-07-23, 58 y.o.   MRN: [id number]  HPI [name] presents for his 64-month diabetes follow-up check.  He is feeling well.  He has lost quite a bit of weight over the last year or so with diet and exercise.  A1c today is 5.5.  No reports of low blood sugars.  Review of Systems No dizziness or syncope.    Objective:   Physical Exam Constitutional:      Appearance: Normal appearance.  HENT:     Head: Normocephalic and atraumatic.  Eyes:     Extraocular Movements: Extraocular movements intact.     Pupils: Pupils are equal, round, and reactive to light.  Cardiovascular:     Rate and Rhythm: Normal rate and regular rhythm.  Skin:    General: Skin is warm and dry.  Neurological:     General: No focal deficit present.     Mental Status: He is alert.  Psychiatric:        Mood and Affect: Mood normal.   BP (!) 125/91 (BP Location: Right Arm, Patient Position: Sitting, Cuff Size: Large)   Pulse 70   Temp (!) 97.3 F (36.3 C) (Oral)   Resp 12   Ht 5\' 8"  (1.727 m)   Wt 180 lb (81.6 kg)   SpO2 100%   BMI 27.37 kg/m  Physical exam checkmarks    Assessment:     History of diabetes, hypertension, dyslipidemia, and gout.  Under well control. 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  Qualified for 1 year.  Information uploaded into [employer].    Plan:     Follow-up in 3 months and repeat A1c at that time

## 2018-11-11 ENCOUNTER — Encounter: Payer: Self-pay | Admitting: General Surgery

## 2019-01-16 ENCOUNTER — Other Ambulatory Visit: Payer: Self-pay

## 2019-01-16 ENCOUNTER — Ambulatory Visit: Payer: Managed Care, Other (non HMO)

## 2019-01-16 DIAGNOSIS — E119 Type 2 diabetes mellitus without complications: Secondary | ICD-10-CM

## 2019-01-16 DIAGNOSIS — Z0000 Encounter for general adult medical examination without abnormal findings: Secondary | ICD-10-CM

## 2019-01-16 LAB — POCT URINALYSIS DIPSTICK
Bilirubin, UA: NEGATIVE
Blood, UA: NEGATIVE
Glucose, UA: NEGATIVE
Ketones, UA: NEGATIVE
Leukocytes, UA: NEGATIVE
Nitrite, UA: NEGATIVE
Protein, UA: NEGATIVE
Spec Grav, UA: 1.025 (ref 1.010–1.025)
Urobilinogen, UA: 0.2 E.U./dL
pH, UA: 6 (ref 5.0–8.0)

## 2019-01-16 NOTE — Progress Notes (Signed)
DOT Physical with Dr. Geoffry Paradise 10/2018.  Scheduled to review labs 01/20/2019 with Darlin Priestly, PA-C (Interim Provider).  AMD

## 2019-01-17 LAB — CMP12+LP+TP+TSH+6AC+PSA+CBC…
ALT: 26 IU/L (ref 0–44)
AST: 27 IU/L (ref 0–40)
Albumin/Globulin Ratio: 2.1 (ref 1.2–2.2)
Albumin: 4.4 g/dL (ref 3.8–4.9)
Alkaline Phosphatase: 70 IU/L (ref 39–117)
BUN/Creatinine Ratio: 9 (ref 9–20)
BUN: 10 mg/dL (ref 6–24)
Basophils Absolute: 0 10*3/uL (ref 0.0–0.2)
Basos: 1 %
Bilirubin Total: 0.8 mg/dL (ref 0.0–1.2)
Calcium: 9.1 mg/dL (ref 8.7–10.2)
Chloride: 104 mmol/L (ref 96–106)
Chol/HDL Ratio: 2.9 ratio (ref 0.0–5.0)
Cholesterol, Total: 135 mg/dL (ref 100–199)
Creatinine, Ser: 1.11 mg/dL (ref 0.76–1.27)
EOS (ABSOLUTE): 0.1 10*3/uL (ref 0.0–0.4)
Eos: 2 %
Estimated CHD Risk: <0.5 times avg. (ref 0.0–1.0)
Free Thyroxine Index: 1.2 (ref 1.2–4.9)
GFR calc Af Amer: 84 mL/min/{1.73_m2} (ref >59)
GFR calc non Af Amer: 73 mL/min/{1.73_m2} (ref >59)
GGT: 19 IU/L (ref 0–65)
Globulin, Total: 2.1 g/dL (ref 1.5–4.5)
Glucose: 161 mg/dL — ABNORMAL HIGH (ref 65–99)
HDL: 46 mg/dL (ref >39)
Hematocrit: 41.5 % (ref 37.5–51.0)
Hemoglobin: 14.1 g/dL (ref 13.0–17.7)
Immature Grans (Abs): 0 10*3/uL (ref 0.0–0.1)
Immature Granulocytes: 0 %
Iron: 85 ug/dL (ref 38–169)
LDH: 185 IU/L (ref 121–224)
LDL Chol Calc (NIH): 73 mg/dL (ref 0–99)
Lymphocytes Absolute: 2.1 10*3/uL (ref 0.7–3.1)
Lymphs: 44 %
MCH: 32.1 pg (ref 26.6–33.0)
MCHC: 34 g/dL (ref 31.5–35.7)
MCV: 95 fL (ref 79–97)
Monocytes Absolute: 0.3 10*3/uL (ref 0.1–0.9)
Monocytes: 7 %
Neutrophils Absolute: 2.2 10*3/uL (ref 1.4–7.0)
Neutrophils: 46 %
Phosphorus: 4.7 mg/dL — ABNORMAL HIGH (ref 2.8–4.1)
Platelets: 176 10*3/uL (ref 150–450)
Potassium: 4 mmol/L (ref 3.5–5.2)
Prostate Specific Ag, Serum: 3.9 ng/mL (ref 0.0–4.0)
RBC: 4.39 x10E6/uL (ref 4.14–5.80)
RDW: 13 % (ref 11.6–15.4)
Sodium: 139 mmol/L (ref 134–144)
T3 Uptake Ratio: 29 % (ref 24–39)
T4, Total: 4.1 ug/dL — ABNORMAL LOW (ref 4.5–12.0)
TSH: 0.88 u[IU]/mL (ref 0.450–4.500)
Total Protein: 6.5 g/dL (ref 6.0–8.5)
Triglycerides: 85 mg/dL (ref 0–149)
Uric Acid: 5.5 mg/dL (ref 3.8–8.4)
VLDL Cholesterol Cal: 16 mg/dL (ref 5–40)
WBC: 4.7 10*3/uL (ref 3.4–10.8)

## 2019-01-17 LAB — MICROALBUMIN / CREATININE URINE RATIO
Creatinine, Urine: 118.1 mg/dL
Microalb/Creat Ratio: <3 mg/g creat (ref 0–29)
Microalbumin, Urine: <3 ug/mL

## 2019-01-17 LAB — HGB A1C W/O EAG: Hgb A1c MFr Bld: 5.9 % — ABNORMAL HIGH (ref 4.8–5.6)

## 2019-01-17 MED ORDER — METFORMIN HCL ER 500 MG PO TB24: 500.0000 mg | ORAL_TABLET | Freq: Every day | ORAL | 0 refills | Status: DC

## 2019-01-17 NOTE — Telephone Encounter (Signed)
Patient requested refill of metformin 500mg  XR po daily.  Last office visit with Dr Geoffry Paradise 10/15/2018 for DOT exam and patient was notified to schedule follow up in 3 months and repeat HgbA1c.  Appt scheduled for 01/23/2019 with [facility] provider.    Last labs 07/10/2018 HgbA1c 5.7  Bridge refill electronic Rx sent to his pharmacy of choice today.  He will need to get new Rx from provider at next office visit for refills.

## 2019-01-23 ENCOUNTER — Other Ambulatory Visit: Payer: Self-pay

## 2019-01-23 ENCOUNTER — Ambulatory Visit: Payer: Managed Care, Other (non HMO) | Admitting: Physician Assistant

## 2019-01-23 VITALS — BP 140/92 | HR 86 | Temp 98.7°F | Ht 68.0 in | Wt 185.8 lb

## 2019-01-23 DIAGNOSIS — E119 Type 2 diabetes mellitus without complications: Secondary | ICD-10-CM

## 2019-01-23 DIAGNOSIS — I10 Essential (primary) hypertension: Secondary | ICD-10-CM

## 2019-01-23 NOTE — Progress Notes (Signed)
Subjective:    Patient ID: Tom Edwards, male    DOB: 03-Oct-1960, 59 y.o.   MRN: [id number]  HPI   Annual exam   DM2, HTN, Gout, Hyperlipidemia feeling well- has gained a few pounds with Covid and Holidays  Saw Dr Skipper Cliche 04/2018  Labs show increased A1C 5.9 and FBS 161- but review reveals the patient was without metformin Rx for a number of weeks - pharmacy miscomunication/physician transition- now corrected and back on Rx for 3-4 days. 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        Intentional weight loss- maintained  HENT: Negative.   Eyes:       Wears glasses- routine care  Respiratory: Negative.   Cardiovascular: Negative.   Gastrointestinal: Negative.   Endocrine:       DM2  Genitourinary: Negative.   Musculoskeletal:       Right meniscus tear 12/2013    Skin: Negative.   Allergic/Immunologic: Negative.   Neurological: Negative.        Objective:   Physical Exam Constitutional:      General: He is not in acute distress.    Appearance: Normal appearance. He is normal weight.  HENT:     Head: Normocephalic and atraumatic.     Right Ear: Ear canal normal. There is no impacted cerumen.     Left Ear: Tympanic membrane and ear canal normal. There is no impacted cerumen.     Nose: Nose normal.     Mouth/Throat:     Mouth: Mucous membranes are moist.  Eyes:     Extraocular Movements: Extraocular movements intact.     Pupils: Pupils are equal, round, and reactive to light.   Cardiovascular:     Rate and Rhythm: Normal rate and regular rhythm.     Pulses: Normal pulses.  Pulmonary:     Effort: Pulmonary effort is normal.     Breath sounds: Normal breath sounds.  Abdominal:     General: Abdomen is flat. Bowel sounds are normal.     Palpations: Abdomen is soft.     Comments: Left inguinal hernia repair in past  Genitourinary:    Comments: Deferred exam, denies symptoms Musculoskeletal:        General: Normal range of motion.     Cervical back: Normal range of motion and neck supple.  Skin:    General: Skin is warm and dry.     Capillary Refill: Capillary refill takes less than 2 seconds.  Neurological:     General: No focal deficit present.     Mental Status: He is alert.  Psychiatric:        Mood and Affect: Mood normal.        Behavior: Behavior normal.        Assessment & Plan:  Continue Metformin 500 ER-  Amlodipine 10mg  1 QD Bisoprolol 10mg  1 QD Allopurinol 300mg  1 po QD All RX to be renewed- patient encouraged to contact us if any medication refill not presented  at pharmacy when expected  Will see for 3 month f/u  DM check A1C,   wt    BP    Rec 30 min brisk walk daily

## 2019-01-28 MED ORDER — AMLODIPINE BESYLATE 10 MG PO TABS: 10.0000 mg | ORAL_TABLET | Freq: Every day | ORAL | 1 refills | Status: DC

## 2019-01-28 MED ORDER — BISOPROLOL FUMARATE 10 MG PO TABS: 10.0000 mg | ORAL_TABLET | Freq: Every day | ORAL | 1 refills | Status: DC

## 2019-01-28 MED ORDER — ALLOPURINOL 300 MG PO TABS: 300.0000 mg | ORAL_TABLET | Freq: Every day | ORAL | 1 refills | Status: DC

## 2019-04-24 ENCOUNTER — Other Ambulatory Visit: Payer: Self-pay

## 2019-04-24 ENCOUNTER — Encounter: Payer: Self-pay | Admitting: Physician Assistant

## 2019-04-24 ENCOUNTER — Ambulatory Visit: Payer: Self-pay | Admitting: Physician Assistant

## 2019-04-24 VITALS — BP 120/80 | HR 72 | Temp 98.6°F | Resp 12 | Ht 68.0 in | Wt 179.0 lb

## 2019-04-24 DIAGNOSIS — E119 Type 2 diabetes mellitus without complications: Secondary | ICD-10-CM

## 2019-04-24 LAB — POCT GLYCOSYLATED HEMOGLOBIN (HGB A1C): Hemoglobin A1C: 5.3 % (ref 4.0–5.6)

## 2019-04-24 MED ORDER — CYCLOBENZAPRINE HCL 10 MG PO TABS: 10.0000 mg | ORAL_TABLET | Freq: Every day | ORAL | 1 refills | Status: DC

## 2019-04-24 MED ORDER — ATORVASTATIN CALCIUM 10 MG PO TABS: 10.0000 mg | ORAL_TABLET | Freq: Every day | ORAL | 1 refills | Status: DC

## 2019-04-24 NOTE — Progress Notes (Signed)
Physical completed 01/23/19 with Laurey Morale, PA-C (Interim Provider).  Has been out of Lipitor for a week.  Needs a refill for it.  A1c Now = 5.3  AMD

## 2019-04-24 NOTE — Progress Notes (Signed)
   Subjective: Diabetes    Patient ID: Tom Edwards, male    DOB: 05-04-1960, 59 y.o.   MRN: [id number]  HPI Patient presents for 67-month checkup for diabetes elevated cholesterol.  Patient continues to show moderate weight loss and increased physical activities.   Review of Systems    Diabetes, hyperlipidemia, hypertension. Objective:   Physical Exam  No acute distress.  Patient weight loss in a year has fallen from 2 10-1 79.  Patient hemoglobin A1c is now 5.3.  Total cholesterol is now 135. HEENT is unremarkable.  Neck is decreased range of motion with right lateral movements.  No adenopathy or bruits.  Lungs are clear to auscultation.  Heart regular rate and rhythm.      Assessment & Plan:  Diabetes and hyperlipidemia well controlled.  Cervical strain. Patient advised continue previous medications.  Will follow up in 3 months.  Patient given a prescription for Flexeril to take as needed for cervical strain.

## 2019-05-05 ENCOUNTER — Other Ambulatory Visit: Payer: Self-pay | Admitting: Registered Nurse

## 2019-05-05 DIAGNOSIS — E119 Type 2 diabetes mellitus without complications: Secondary | ICD-10-CM

## 2019-05-05 NOTE — Telephone Encounter (Signed)
[facility] pt. Thanks!

## 2019-06-10 ENCOUNTER — Other Ambulatory Visit: Payer: Self-pay

## 2019-06-10 DIAGNOSIS — I10 Essential (primary) hypertension: Secondary | ICD-10-CM

## 2019-06-10 MED ORDER — BISOPROLOL FUMARATE 10 MG PO TABS: 10.0000 mg | ORAL_TABLET | Freq: Every day | ORAL | 2 refills | Status: DC

## 2019-07-24 ENCOUNTER — Other Ambulatory Visit: Payer: Self-pay

## 2019-07-24 DIAGNOSIS — E119 Type 2 diabetes mellitus without complications: Secondary | ICD-10-CM

## 2019-07-25 LAB — BUN+CREAT
BUN/Creatinine Ratio: 12 (ref 9–20)
BUN: 14 mg/dL (ref 6–24)
Creatinine, Ser: 1.21 mg/dL (ref 0.76–1.27)
GFR calc Af Amer: 75 mL/min/{1.73_m2} (ref >59)
GFR calc non Af Amer: 65 mL/min/{1.73_m2} (ref >59)

## 2019-07-25 LAB — HGB A1C W/O EAG: Hgb A1c MFr Bld: 6.3 % — ABNORMAL HIGH (ref 4.8–5.6)

## 2019-07-31 ENCOUNTER — Ambulatory Visit: Payer: 59

## 2019-08-06 ENCOUNTER — Other Ambulatory Visit: Payer: Self-pay

## 2019-08-06 ENCOUNTER — Ambulatory Visit: Payer: Self-pay | Admitting: Emergency Medicine

## 2019-08-06 ENCOUNTER — Encounter: Payer: Self-pay | Admitting: Emergency Medicine

## 2019-08-06 VITALS — BP 140/93 | HR 66 | Temp 98.0°F | Resp 12 | Ht 68.5 in | Wt 192.0 lb

## 2019-08-06 DIAGNOSIS — I10 Essential (primary) hypertension: Secondary | ICD-10-CM

## 2019-08-06 DIAGNOSIS — E119 Type 2 diabetes mellitus without complications: Secondary | ICD-10-CM

## 2019-08-06 MED ORDER — AMLODIPINE BESYLATE 10 MG PO TABS: 10.0000 mg | ORAL_TABLET | Freq: Every day | ORAL | 2 refills | Status: DC

## 2019-08-06 NOTE — Progress Notes (Signed)
  Subjective:     Patient ID: Tom Edwards, male   DOB: Mar 20, 1960, 59 y.o.   MRN: [id number]  HPI Here for 10-month post physical appointment to review labs that were collected on 07/24/2019.  Patient admits that he has been slacked on taking his Metformin and also what he has been eating.  He is already aware that his A1c is elevated.  He also currently is having some problems with golfers elbow but has not been taking any anti-inflammatories.  He is wearing a splint on his elbow.  He states that he was able to play 11 holes of golf yesterday before it began hurting.  Review of Systems Positive right elbow pain.    Objective:   Physical Exam HEENT:  WNL Lungs:  Clear bilaterally to auscultation. Heart: Regular rate and rhythm without murmur.    Assessment:     Elevated A1c. Right elbow medial epicondylitis.    Plan:     Will be more diligent about taking his Metformin as directed and also his diabetic diet.  Also he will take anti-inflammatories and ice his elbow.  We discussed his not wanting to be on dialysis or have the medical problems that his parents have.  We will recheck his glucose and A1c in 6 months when he has his physical.

## 2019-08-06 NOTE — Progress Notes (Signed)
DM - presents for 6 month post physical appt to review lab results collected on 07/24/19.  States he's slack with Metformin the past few months.  Aware that A1c went up.  AMD

## 2019-11-21 ENCOUNTER — Other Ambulatory Visit: Payer: Self-pay | Admitting: Physician Assistant

## 2019-11-21 ENCOUNTER — Other Ambulatory Visit: Payer: Self-pay | Admitting: Internal Medicine

## 2019-11-21 DIAGNOSIS — E119 Type 2 diabetes mellitus without complications: Secondary | ICD-10-CM

## 2019-11-21 DIAGNOSIS — E785 Hyperlipidemia, unspecified: Secondary | ICD-10-CM

## 2019-11-21 DIAGNOSIS — M1A9XX0 Chronic gout, unspecified, without tophus (tophi): Secondary | ICD-10-CM

## 2019-11-22 MED ORDER — ALLOPURINOL 300 MG PO TABS: 300.0000 mg | ORAL_TABLET | Freq: Every day | ORAL | 2 refills | Status: DC

## 2019-11-22 MED ORDER — ATORVASTATIN CALCIUM 10 MG PO TABS: 10.0000 mg | ORAL_TABLET | Freq: Every day | ORAL | 2 refills | Status: DC

## 2020-01-21 ENCOUNTER — Other Ambulatory Visit: Payer: Self-pay

## 2020-01-21 ENCOUNTER — Ambulatory Visit: Payer: Self-pay

## 2020-01-21 DIAGNOSIS — Z01818 Encounter for other preprocedural examination: Secondary | ICD-10-CM

## 2020-01-21 LAB — POCT URINALYSIS DIPSTICK
Bilirubin, UA: NEGATIVE
Blood, UA: NEGATIVE
Glucose, UA: NEGATIVE
Ketones, UA: NEGATIVE
Leukocytes, UA: NEGATIVE
Nitrite, UA: NEGATIVE
Protein, UA: NEGATIVE
Spec Grav, UA: 1.015 (ref 1.010–1.025)
Urobilinogen, UA: 0.2 E.U./dL
pH, UA: 6 (ref 5.0–8.0)

## 2020-01-21 NOTE — Progress Notes (Signed)
Scheduled to complete physical 01/28/20.  AMD

## 2020-01-22 LAB — CMP12+LP+TP+TSH+6AC+PSA+CBC…
ALT: 22 IU/L (ref 0–44)
AST: 21 IU/L (ref 0–40)
Albumin/Globulin Ratio: 1.7 (ref 1.2–2.2)
Albumin: 4.7 g/dL (ref 3.8–4.9)
Alkaline Phosphatase: 84 IU/L (ref 44–121)
BUN/Creatinine Ratio: 12 (ref 9–20)
BUN: 15 mg/dL (ref 6–24)
Basophils Absolute: 0 10*3/uL (ref 0.0–0.2)
Basos: 1 %
Bilirubin Total: 0.4 mg/dL (ref 0.0–1.2)
Calcium: 9.3 mg/dL (ref 8.7–10.2)
Chloride: 104 mmol/L (ref 96–106)
Chol/HDL Ratio: 3.5 ratio (ref 0.0–5.0)
Cholesterol, Total: 138 mg/dL (ref 100–199)
Creatinine, Ser: 1.25 mg/dL (ref 0.76–1.27)
EOS (ABSOLUTE): 0.1 10*3/uL (ref 0.0–0.4)
Eos: 1 %
Estimated CHD Risk: 0.5 times avg. (ref 0.0–1.0)
Free Thyroxine Index: 1.5 (ref 1.2–4.9)
GFR calc Af Amer: 72 mL/min/{1.73_m2} (ref >59)
GFR calc non Af Amer: 63 mL/min/{1.73_m2} (ref >59)
GGT: 23 IU/L (ref 0–65)
Globulin, Total: 2.8 g/dL (ref 1.5–4.5)
Glucose: 144 mg/dL — ABNORMAL HIGH (ref 65–99)
HDL: 40 mg/dL (ref >39)
Hematocrit: 42.9 % (ref 37.5–51.0)
Hemoglobin: 14.6 g/dL (ref 13.0–17.7)
Immature Grans (Abs): 0 10*3/uL (ref 0.0–0.1)
Immature Granulocytes: 0 %
Iron: 68 ug/dL (ref 38–169)
LDH: 190 IU/L (ref 121–224)
LDL Chol Calc (NIH): 79 mg/dL (ref 0–99)
Lymphocytes Absolute: 2.6 10*3/uL (ref 0.7–3.1)
Lymphs: 47 %
MCH: 31.3 pg (ref 26.6–33.0)
MCHC: 34 g/dL (ref 31.5–35.7)
MCV: 92 fL (ref 79–97)
Monocytes Absolute: 0.5 10*3/uL (ref 0.1–0.9)
Monocytes: 8 %
Neutrophils Absolute: 2.4 10*3/uL (ref 1.4–7.0)
Neutrophils: 43 %
Phosphorus: 4.1 mg/dL (ref 2.8–4.1)
Platelets: 192 10*3/uL (ref 150–450)
Potassium: 4.1 mmol/L (ref 3.5–5.2)
Prostate Specific Ag, Serum: 3.8 ng/mL (ref 0.0–4.0)
RBC: 4.66 x10E6/uL (ref 4.14–5.80)
RDW: 12.7 % (ref 11.6–15.4)
Sodium: 139 mmol/L (ref 134–144)
T3 Uptake Ratio: 31 % (ref 24–39)
T4, Total: 4.8 ug/dL (ref 4.5–12.0)
TSH: 1.57 u[IU]/mL (ref 0.450–4.500)
Total Protein: 7.5 g/dL (ref 6.0–8.5)
Triglycerides: 101 mg/dL (ref 0–149)
Uric Acid: 5.6 mg/dL (ref 3.8–8.4)
VLDL Cholesterol Cal: 19 mg/dL (ref 5–40)
WBC: 5.5 10*3/uL (ref 3.4–10.8)

## 2020-01-22 LAB — MICROALBUMIN / CREATININE URINE RATIO
Creatinine, Urine: 43.3 mg/dL
Microalb/Creat Ratio: <7 mg/g creat (ref 0–29)
Microalbumin, Urine: <3 ug/mL

## 2020-01-22 LAB — HGB A1C W/O EAG: Hgb A1c MFr Bld: 6.8 % — ABNORMAL HIGH (ref 4.8–5.6)

## 2020-01-28 ENCOUNTER — Other Ambulatory Visit: Payer: Self-pay

## 2020-01-28 ENCOUNTER — Ambulatory Visit: Payer: Self-pay | Admitting: Adult Medicine

## 2020-01-28 ENCOUNTER — Encounter: Payer: Self-pay | Admitting: Adult Medicine

## 2020-01-28 VITALS — BP 140/90 | HR 88 | Temp 99.3°F | Resp 14 | Wt 200.0 lb

## 2020-01-28 DIAGNOSIS — Z01818 Encounter for other preprocedural examination: Secondary | ICD-10-CM

## 2020-01-28 DIAGNOSIS — Z0000 Encounter for general adult medical examination without abnormal findings: Secondary | ICD-10-CM

## 2020-01-28 DIAGNOSIS — R7309 Other abnormal glucose: Secondary | ICD-10-CM

## 2020-01-28 DIAGNOSIS — E663 Overweight: Secondary | ICD-10-CM

## 2020-01-28 DIAGNOSIS — E119 Type 2 diabetes mellitus without complications: Secondary | ICD-10-CM

## 2020-01-28 NOTE — Progress Notes (Signed)
Subjective:    Patient ID: Tom Edwards, male    DOB: [birth year], 60 y.o.   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 Soreness does not prevent activity PMH gout lt toe asymptomatic x 3y Htn x10y  Dm x10y  FH mother  IDDM brother NiDDM SH Etoh buorbon 2d/ wk       Works as a Conservation officer, [employer] for [employer]  Past surgeries  Inguinal herniaLeft 07/16/2018  S/P inguinal hernia repair 08/07/2018   Ros Reports as above, otherwise unremarkable        Objective:   Physical Exam Constitutional:      Appearance: Normal appearance.  HENT:     Head: Normocephalic and atraumatic.     Right Ear: Tympanic membrane normal.     Left Ear: Tympanic membrane normal.     Nose: Nose normal.     Mouth/Throat:     Mouth: Mucous membranes are moist.  Eyes:     Extraocular Movements: Extraocular movements intact.     Conjunctiva/sclera: Conjunctivae normal.     Pupils: Pupils are equal, round, and reactive to light.  Cardiovascular:     Rate and Rhythm: Normal rate and regular rhythm.     Pulses: Normal pulses.     Heart sounds: Normal heart sounds. No murmur heard.   Abdominal:     General: Abdomen is flat. Bowel sounds are normal.     Palpations: Abdomen is soft.     Hernia: No hernia is present.  Genitourinary:    Testes: Normal.     Rectum: Guaiac result negative.     Comments: Prostrate 3x.3.5  nontender boggy no nodules guiac neg Musculoskeletal:        General: No tenderness. Normal range of motion.     Cervical back: Normal range of motion and neck supple.  Skin:    General: Skin is dry.  Neurological:     General: No focal deficit present.     Mental Status: He is alert.     Borderline bp 140/90  01/21/2020    Most Recent Value 02/17/2015 - 02/16/2020  Hemoglobin A1C 4.8 - 5.6 % 6.8 (A) Comment: Prediabetes: 5.7 -  6.4 Diabetes: >6.4 Glycemic control for adults with diabetes: <7.0   Glucose 144High mg/dL Chol/HDL Ratio 3.5 ratio   Uric Acid 5.6 mg/dL  Estimated CHD Risk 0.5 times avg.   BUN 15 mg/dL TSH 1.570 uIU/mL  Creatinine, Ser 1.25 mg/dL T4, Total 4.8 ug/dL  GFR calc non Af Amer 63 mL/min/1.73 T3 Uptake Ratio 31 %  GFR calc Af Amer 72 mL/min/1.73  Free Thyroxine Index 1.5  BUN/Creatinine Ratio 12 Prostate Specific Ag, Serum 3.8 ng/mL   Sodium 139 mmol/L WBC 5.5 x10E3/uL  Potassium 4.1 mmol/L RBC 4.66 x10E6/uL  Chloride 104 mmol/L Hemoglobin 14.6 g/dL  Calcium 9.3 mg/dL Hematocrit 42.9 %  Phosphorus 4.1 mg/dL MCV 92 fL  Total Protein 7.5 g/dL MCH 31.3 pg  Albumin 4.7 g/dL MCHC 34.0 g/dL  Globulin, Total 2.8 g/dL RDW 12.7 %  Albumin/Globulin Ratio 1.7 Platelets 192 x10E3/uL  Bilirubin Total 0.4 mg/dL Neutrophils 43 %  Alkaline Phosphatase 84 IU/L  Lymphs 47 %  LDH 190 IU/L Monocytes 8 %  AST 21 IU/L Eos 1 %  ALT 22 IU/L Basos 1 %  GGT 23 IU     Sinus  Rhythm  Lt axs. rt/l atrial enlargement   nl intervals-  Nonspecific T-abnormality.     Assessment & Plan:  Hx gout without episode x3y Hx Htn well controlled Hyperlipidemia controlled on current med NiDDm  Diet discussed, client questions answerd regarding  otc Use of cinnamon, licorice, tumeric Bmi> 25 with abd girth 42.25'" annual exam and Lab discussed hba1c 6.8 fsb 144  Will need home checks am fbs log F/u hba1c 62mon  Consider nutritionist referral

## 2020-01-30 ENCOUNTER — Encounter: Payer: Self-pay | Admitting: Adult Medicine

## 2020-01-30 LAB — SARS-COV-2, NAA 2 DAY TAT

## 2020-01-30 LAB — NOVEL CORONAVIRUS, NAA: SARS-CoV-2, NAA: NOT DETECTED

## 2020-02-08 ENCOUNTER — Other Ambulatory Visit: Payer: Self-pay | Admitting: Adult Medicine

## 2020-02-08 DIAGNOSIS — E119 Type 2 diabetes mellitus without complications: Secondary | ICD-10-CM

## 2020-02-08 MED ORDER — METFORMIN HCL 500 MG PO TABS: 500.0000 mg | ORAL_TABLET | Freq: Two times a day (BID) | ORAL | 1 refills | Status: DC

## 2020-02-08 NOTE — Progress Notes (Signed)
Being new to epic computer to patient format it has take out of office time but in review of the last 29yr blood sugar, weight, and associated medication your bs is not well controlled. It is recommended that wt mgmt by diet portion with exercise getting you heart beat to 100 for minimum of 63m/d with core exercise training Will help  Your health and improve efficacy of your medication

## 2020-03-12 ENCOUNTER — Other Ambulatory Visit: Payer: Self-pay

## 2020-03-12 DIAGNOSIS — I10 Essential (primary) hypertension: Secondary | ICD-10-CM

## 2020-03-12 MED ORDER — AMLODIPINE BESYLATE 10 MG PO TABS: 10.0000 mg | ORAL_TABLET | Freq: Every day | ORAL | 3 refills | Status: DC

## 2020-05-18 ENCOUNTER — Other Ambulatory Visit: Payer: Self-pay

## 2020-05-18 DIAGNOSIS — I10 Essential (primary) hypertension: Secondary | ICD-10-CM

## 2020-05-18 MED ORDER — BISOPROLOL FUMARATE 10 MG PO TABS: 10.0000 mg | ORAL_TABLET | Freq: Every day | ORAL | 0 refills | Status: DC

## 2020-06-01 ENCOUNTER — Other Ambulatory Visit: Payer: Self-pay

## 2020-06-01 DIAGNOSIS — I10 Essential (primary) hypertension: Secondary | ICD-10-CM

## 2020-06-01 MED ORDER — AMLODIPINE BESYLATE 10 MG PO TABS: 10.0000 mg | ORAL_TABLET | Freq: Every day | ORAL | 3 refills | Status: DC

## 2020-07-14 ENCOUNTER — Other Ambulatory Visit: Payer: Self-pay

## 2020-07-14 DIAGNOSIS — E119 Type 2 diabetes mellitus without complications: Secondary | ICD-10-CM

## 2020-07-14 NOTE — Progress Notes (Signed)
Pt completed labs for a1c follow up. CL,RMA

## 2020-07-15 LAB — HGB A1C W/O EAG: Hgb A1c MFr Bld: 6.6 % — ABNORMAL HIGH (ref 4.8–5.6)

## 2020-07-20 ENCOUNTER — Encounter: Payer: Self-pay | Admitting: Physician Assistant

## 2020-07-20 ENCOUNTER — Other Ambulatory Visit: Payer: Self-pay

## 2020-07-20 ENCOUNTER — Ambulatory Visit: Payer: Self-pay | Admitting: Physician Assistant

## 2020-07-20 DIAGNOSIS — E119 Type 2 diabetes mellitus without complications: Secondary | ICD-10-CM

## 2020-07-20 NOTE — Progress Notes (Signed)
   Subjective: Diabetes    Patient ID: Tom Edwards, male    DOB: 1960/03/25, 60 y.o.   MRN: [id number]  HPI Patient presents for 61-month follow-up for diabetes.  Patient was changed from metformin 500 XR daily to metformin 500 mg twice daily.  Patient has been taking metformin at 500 mg daily.  Patient has not kept a daily diabetic log.  Hemoglobin A1c today was 6.6.  Patient also was prescribed two blood pressure medications Zebeta in the morning and Norvasc at night.  Patient admits to noncompliance of nocturnal dosage.  Review of Systems Diabetes, hyperlipidemia, hypertension.    Objective:   Physical Exam No acute distress.  Patient is not taking morning doses of hypertension medicine.  BP is 146/99. HEENT is unremarkable.  Neck is supple without adenopathy or bruits.  Lungs are clear to auscultation.  Heart regular rate and rhythm. Abdomen slightly distended secondary to body habitus.     Assessment & Plan: Diabetes.   Discussed hemoglobin A1c is in range for diabetic.  Advised to increase metformin to twice a day.  Patient also advised to do a 3-day blood pressure check with his home unit without taking the Norvasc for 1 week.  Patient will return back in 1 week for reevaluation hypertension status post 3-day blood pressure check.  Patient also advised to bring in his home unit that he is using to monitor blood pressure.

## 2020-07-27 ENCOUNTER — Other Ambulatory Visit: Payer: Self-pay

## 2020-07-27 ENCOUNTER — Ambulatory Visit: Payer: Self-pay | Admitting: Physician Assistant

## 2020-07-27 VITALS — BP 153/97 | HR 63 | Temp 97.6°F | Resp 14 | Ht 69.0 in | Wt 200.0 lb

## 2020-07-27 DIAGNOSIS — Z0130 Encounter for examination of blood pressure without abnormal findings: Secondary | ICD-10-CM

## 2020-07-27 DIAGNOSIS — I10 Essential (primary) hypertension: Secondary | ICD-10-CM

## 2020-07-27 MED ORDER — AMLODIPINE BESYLATE 10 MG PO TABS: 10.0000 mg | ORAL_TABLET | Freq: Every day | ORAL | 3 refills | Status: DC

## 2020-07-27 NOTE — Progress Notes (Signed)
Pt requesting refills on Allopurinol, amlodipine and bisoprolol.

## 2020-07-27 NOTE — Progress Notes (Signed)
   Subjective: Hypertension    Patient ID: Tom Edwards, male    DOB: 26-Dec-1960, 60 y.o.   MRN: [id number]  HPI Patient presents for evaluation of elevated blood pressure.  Patient is on 2 medications but when he ran out of 1 medication he decided not to get it refilled.  Patient has been taking Bisoprolol at 10 mg.  Patient did not contact clinic to get a refill of Norvasc.  It was also noted that patient home blood pressure machine cuff is not adequate for his arm.  Explained to the patient a small BP cuff will give higher readings.   Review of Systems Diabetes, hyperlipidemia, and hypertension.    Objective:   Physical Exam No acute distress.  Temperature 97.6, pulse 63, respiration 14, BP is 153/97, patient 97% O2 sat on room air.  Patient's weight is  200 pounds and BMI is 29.5.       Assessment & Plan: Hypertension.   Advised patient that hypertension is suboptimally controlled with oral medication.  Advised to restart Norvasc and follow-up for 3-day blood pressure check 2 weeks.

## 2020-08-02 ENCOUNTER — Other Ambulatory Visit: Payer: Self-pay

## 2020-08-02 DIAGNOSIS — I10 Essential (primary) hypertension: Secondary | ICD-10-CM

## 2020-08-03 MED ORDER — BISOPROLOL FUMARATE 10 MG PO TABS: 10.0000 mg | ORAL_TABLET | Freq: Every day | ORAL | 3 refills | Status: DC

## 2020-08-12 ENCOUNTER — Encounter: Payer: Self-pay | Admitting: Physician Assistant

## 2020-08-12 ENCOUNTER — Ambulatory Visit: Payer: Self-pay | Admitting: Physician Assistant

## 2020-08-12 ENCOUNTER — Other Ambulatory Visit: Payer: Self-pay

## 2020-08-12 VITALS — BP 150/90 | HR 82 | Temp 97.6°F | Resp 14 | Ht 69.0 in | Wt 197.0 lb

## 2020-08-12 DIAGNOSIS — I10 Essential (primary) hypertension: Secondary | ICD-10-CM

## 2020-08-12 NOTE — Progress Notes (Signed)
   Subjective: Hypertension    Patient ID: Tom Edwards, male    DOB: 12-Jan-1960, 60 y.o.   MRN: [id number]  HPI Patient presents for 2-week blood pressure check after restarting medication as directed.  Patient pressures at home blood pressure monitoring unit and brought to the office today for comparison.  Patient also voiced concerns of having loose stools secondary to increased dosage of metformin.   Review of Systems Diabetes, hyperlipidemia, and hypertension.    Objective:   Physical Exam No acute distress.  Temperature 97.6, pulse 82, respiration 14, BP is 150/90, patient 92% O2 sat on room air.  Patient with a high 97 pounds BMI is 29.1.  Review of patient BP log at home shows average BP reading of 138 over 84.  Patient appears slightly anxious and is very talkative in the clinic.       Assessment & Plan: Hypertension   Patient is showing improvement of blood pressure after taking restarting norvasc and Zebeta as directed.  Advised to continue previous medication to therapeutic doses and follow-up in 3 months.  Continue to monitor loose stools and if needed may take over-the-counter Imodium.

## 2020-09-07 ENCOUNTER — Encounter: Payer: Self-pay | Admitting: Physician Assistant

## 2020-09-08 ENCOUNTER — Telehealth: Payer: Self-pay

## 2020-09-08 DIAGNOSIS — Z1211 Encounter for screening for malignant neoplasm of colon: Secondary | ICD-10-CM

## 2020-09-08 NOTE — Telephone Encounter (Signed)
Spoke with Butch Penny at [facility] [phone]) & she said to send them a referral for 10 year follow-up colonoscopy screening.  AMD

## 2020-09-15 ENCOUNTER — Other Ambulatory Visit: Payer: Self-pay

## 2020-09-15 DIAGNOSIS — E119 Type 2 diabetes mellitus without complications: Secondary | ICD-10-CM

## 2020-09-16 MED ORDER — METFORMIN HCL 500 MG PO TABS: 500.0000 mg | ORAL_TABLET | Freq: Two times a day (BID) | ORAL | 3 refills | Status: DC

## 2020-10-14 ENCOUNTER — Encounter: Payer: Self-pay | Admitting: Physician Assistant

## 2020-10-14 ENCOUNTER — Ambulatory Visit: Payer: Self-pay | Admitting: Physician Assistant

## 2020-10-14 ENCOUNTER — Other Ambulatory Visit: Payer: Self-pay

## 2020-10-14 VITALS — BP 140/101 | HR 87 | Temp 97.6°F | Resp 14 | Ht 69.0 in | Wt 199.0 lb

## 2020-10-14 DIAGNOSIS — S68119D Complete traumatic metacarpophalangeal amputation of unspecified finger, subsequent encounter: Secondary | ICD-10-CM

## 2020-10-14 MED ORDER — NEOSPORIN PLUS PAIN RELIEF MS 3.5-10000-10 EX CREA: TOPICAL_CREAM | Freq: Two times a day (BID) | CUTANEOUS | 0 refills | Status: DC

## 2020-10-14 NOTE — Progress Notes (Signed)
[place] was [place], when insodent happened. Hospital didn't the have capapability to help. Pt received xray,antibiotics, tdap./CL,RMA

## 2020-10-14 NOTE — Progress Notes (Signed)
   Subjective: Distal finger amputation    Patient ID: Tom Edwards, male    DOB: [birth year], 60 y.o.   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  Patient was seen in emergency room [facility].  Placed on antibiotics and given instructed for wound care.  Patient advised follow-up orthopedics.   Review of Systems Negative except for complaint    Objective:   Physical Exam  No acute distress.  Patient has laceration/avulsion of the distal fourth digit left hand.  No osseous injury.  Neurovascular intact with free Nikkel range of motion.    Assessment & Plan:   As we cleaned and bandaged.  Patient given a prescription for Neosporin.  Advised continue antibiotics.  Walk-in consult to [facility] for definitive evaluation and treatment.

## 2020-10-18 ENCOUNTER — Encounter: Payer: Self-pay | Admitting: General Surgery

## 2020-10-19 ENCOUNTER — Ambulatory Visit: Payer: 59 | Admitting: Physician Assistant

## 2021-02-11 ENCOUNTER — Ambulatory Visit: Payer: 59 | Admitting: Anesthesiology

## 2021-02-11 ENCOUNTER — Other Ambulatory Visit: Payer: Self-pay

## 2021-02-11 ENCOUNTER — Encounter
Admission: RE | Disposition: A | Discharge status: Home / Self Care | Payer: Self-pay | Source: Home / Self Care | Attending: Gastroenterology

## 2021-02-11 ENCOUNTER — Ambulatory Visit
Admission: RE | Admit: 2021-02-11 | Discharge: 2021-02-11 | Disposition: A | Discharge status: Home / Self Care | Payer: 59 | Attending: Gastroenterology | Admitting: Gastroenterology

## 2021-02-11 DIAGNOSIS — I10 Essential (primary) hypertension: Secondary | ICD-10-CM | POA: Insufficient documentation

## 2021-02-11 DIAGNOSIS — Z1211 Encounter for screening for malignant neoplasm of colon: Secondary | ICD-10-CM | POA: Diagnosis not present

## 2021-02-11 DIAGNOSIS — K640 First degree hemorrhoids: Secondary | ICD-10-CM | POA: Insufficient documentation

## 2021-02-11 DIAGNOSIS — K5730 Diverticulosis of large intestine without perforation or abscess without bleeding: Secondary | ICD-10-CM | POA: Insufficient documentation

## 2021-02-11 DIAGNOSIS — D122 Benign neoplasm of ascending colon: Secondary | ICD-10-CM | POA: Diagnosis not present

## 2021-02-11 DIAGNOSIS — K649 Unspecified hemorrhoids: Secondary | ICD-10-CM | POA: Diagnosis not present

## 2021-02-11 DIAGNOSIS — D123 Benign neoplasm of transverse colon: Secondary | ICD-10-CM | POA: Diagnosis not present

## 2021-02-11 DIAGNOSIS — E119 Type 2 diabetes mellitus without complications: Secondary | ICD-10-CM | POA: Insufficient documentation

## 2021-02-11 DIAGNOSIS — G4730 Sleep apnea, unspecified: Secondary | ICD-10-CM | POA: Insufficient documentation

## 2021-02-11 DIAGNOSIS — Z9889 Other specified postprocedural states: Secondary | ICD-10-CM | POA: Diagnosis not present

## 2021-02-11 DIAGNOSIS — K635 Polyp of colon: Secondary | ICD-10-CM | POA: Diagnosis not present

## 2021-02-11 HISTORY — PX: COLONOSCOPY WITH PROPOFOL: SHX5780

## 2021-02-11 LAB — GLUCOSE, CAPILLARY: Glucose-Capillary: 128 mg/dL — ABNORMAL HIGH (ref 70–99)

## 2021-02-11 SURGERY — COLONOSCOPY WITH PROPOFOL
Anesthesia: General

## 2021-02-11 MED ORDER — LIDOCAINE HCL (CARDIAC) PF 100 MG/5ML IV SOSY
PREFILLED_SYRINGE | INTRAVENOUS | Status: DC | PRN
  Administered 2021-02-11: 50 mg via INTRAVENOUS

## 2021-02-11 MED ORDER — PROPOFOL 10 MG/ML IV BOLUS
INTRAVENOUS | Status: DC | PRN
  Administered 2021-02-11: 50 mg via INTRAVENOUS

## 2021-02-11 MED ORDER — FENTANYL CITRATE (PF) 100 MCG/2ML IJ SOLN
INTRAMUSCULAR | Status: DC | PRN
  Administered 2021-02-11: 25 ug via INTRAVENOUS
  Administered 2021-02-11: 50 ug via INTRAVENOUS
  Administered 2021-02-11: 4 ug via INTRAVENOUS

## 2021-02-11 MED ORDER — FENTANYL CITRATE (PF) 100 MCG/2ML IJ SOLN
INTRAMUSCULAR | Status: AC
  Filled 2021-02-11: qty 2

## 2021-02-11 MED ORDER — PROPOFOL 500 MG/50ML IV EMUL
INTRAVENOUS | Status: DC | PRN
  Administered 2021-02-11: 50 ug/kg/min via INTRAVENOUS

## 2021-02-11 MED ORDER — SODIUM CHLORIDE 0.9 % IV SOLN: INTRAVENOUS | Status: DC

## 2021-02-11 MED ORDER — MIDAZOLAM HCL 2 MG/2ML IJ SOLN
INTRAMUSCULAR | Status: DC | PRN
  Administered 2021-02-11: 2 mg via INTRAVENOUS

## 2021-02-11 MED ORDER — MIDAZOLAM HCL 2 MG/2ML IJ SOLN
INTRAMUSCULAR | Status: AC
  Filled 2021-02-11: qty 2

## 2021-02-11 NOTE — H&P (Signed)
Outpatient short stay form Pre-procedure 02/11/2021  Tom Rubenstein, MD  Primary Physician: Pcp, No  Reason for visit:  Screening  History of present illness:    61 y/o gentleman with history of hypertension here for screening colonoscopy. Last colonoscopy was in 2012 and reports it was normal. No blood thinners. History of inguinal hernia repair. No family history of GI malignancies. No significant symptoms.    Current Facility-Administered Medications:    0.9 %  sodium chloride infusion, , Intravenous, Continuous, Dessirae Scarola, Hilton Cork, MD, Last Rate: 20 mL/hr at 02/11/21 1242, New Bag at 02/11/21 1242  Medications Prior to Admission  Medication Sig Dispense Refill Last Dose   allopurinol (ZYLOPRIM) 300 MG tablet Take 1 tablet (300 mg total) by mouth daily. 90 tablet 2 02/10/2021   amLODipine (NORVASC) 10 MG tablet Take 1 tablet (10 mg total) by mouth daily. 90 tablet 3 02/10/2021   amLODipine (NORVASC) 10 MG tablet Take 1 tablet (10 mg total) by mouth daily. 90 tablet 3 02/10/2021   atorvastatin (LIPITOR) 10 MG tablet Take 1 tablet (10 mg total) by mouth daily. 90 tablet 2 Past Week   bisoprolol (ZEBETA) 10 MG tablet Take 1 tablet (10 mg total) by mouth daily. 90 tablet 3 02/10/2021   metFORMIN (GLUCOPHAGE) 500 MG tablet Take 1 tablet (500 mg total) by mouth 2 (two) times daily with a meal. 180 tablet 3 02/10/2021   neomycin-polymyxin-pramoxine (NEOSPORIN PLUS) 1 % cream Apply topically 2 (two) times daily. 14.2 g 0 02/10/2021     No Known Allergies   Past Medical History:  Diagnosis Date   Acute medial meniscus tear    DM (diabetes mellitus), type 2 (HCC)    Gout    Hyperlipemia    Hypertension    Over weight    Sedentary lifestyle    Sleep apnea    no cpap   Vertigo    Vertigo     Review of systems:  Otherwise negative.    Physical Exam  Gen: Alert, oriented. Appears stated age.  HEENT: PERRLA. Lungs: No respiratory distress CV: RRR Abd: soft, benign, no masses Ext:  No edema    Planned procedures: Proceed with colonoscopy. The patient understands the nature of the planned procedure, indications, risks, alternatives and potential complications including but not limited to bleeding, infection, perforation, damage to internal organs and possible oversedation/side effects from anesthesia. The patient agrees and gives consent to proceed.  Please refer to procedure notes for findings, recommendations and patient disposition/instructions.     Tom Rubenstein, MD [facility]

## 2021-02-11 NOTE — Interval H&P Note (Signed)
History and Physical Interval Note:  02/11/2021 12:58 PM  Tom Edwards  has presented today for surgery, with the diagnosis of colon cancer screening.  The various methods of treatment have been discussed with the patient and family. After consideration of risks, benefits and other options for treatment, the patient has consented to  Procedure(s): COLONOSCOPY WITH PROPOFOL (N/A) as a surgical intervention.  The patient's history has been reviewed, patient examined, no change in status, stable for surgery.  I have reviewed the patient's chart and labs.  Questions were answered to the patient's satisfaction.     Lesly Rubenstein  Ok to proceed with colonoscopy

## 2021-02-11 NOTE — Anesthesia Postprocedure Evaluation (Signed)
Anesthesia Post Note  Patient: Tom Edwards  Procedure(s) Performed: COLONOSCOPY WITH PROPOFOL  Patient location during evaluation: PACU Anesthesia Type: General Level of consciousness: awake and awake and alert Pain management: pain level controlled Vital Signs Assessment: post-procedure vital signs reviewed and stable Respiratory status: spontaneous breathing and nonlabored ventilation Cardiovascular status: stable Anesthetic complications: no   No notable events documented.   Last Vitals:  Vitals:   02/11/21 1333 02/11/21 1345  BP: 114/89 (!) 129/97  Pulse: 77 69  Resp: 19 20  Temp:  (!) 35.6 C  SpO2: 97% 98%    Last Pain:  Vitals:   02/11/21 1345  TempSrc: Temporal  PainSc: 0-No pain                 VAN STAVEREN,Juliene Kirsh

## 2021-02-11 NOTE — Transfer of Care (Signed)
Immediate Anesthesia Transfer of Care Note  Patient: Tom Edwards  Procedure(s) Performed: COLONOSCOPY WITH PROPOFOL  Patient Location: PACU  Anesthesia Type:General  Level of Consciousness: sedated  Airway & Oxygen Therapy: Patient Spontanous Breathing and Patient connected to nasal cannula oxygen  Post-op Assessment: Report given to RN and Post -op Vital signs reviewed and stable  Post vital signs: Reviewed and stable  Last Vitals:  Vitals Value Taken Time  BP 121/91 02/11/21 1324  Temp    Pulse 77 02/11/21 1324  Resp 15 02/11/21 1324  SpO2 97 % 02/11/21 1324    Last Pain:  Vitals:   02/11/21 1228  TempSrc: Temporal  PainSc: 0-No pain         Complications: No notable events documented.

## 2021-02-11 NOTE — Anesthesia Preprocedure Evaluation (Signed)
Anesthesia Evaluation  Patient identified by MRN, date of birth, ID band Patient awake    Reviewed: Allergy & Precautions, NPO status , Patient's Chart, lab work & pertinent test results  Airway Mallampati: II  TM Distance: >3 FB Neck ROM: full    Dental  (+) Teeth Intact   Pulmonary neg pulmonary ROS, sleep apnea , Current Smoker,    Pulmonary exam normal breath sounds clear to auscultation       Cardiovascular Exercise Tolerance: Good hypertension, Pt. on medications negative cardio ROS Normal cardiovascular exam Rhythm:Regular Rate:Normal     Neuro/Psych negative neurological ROS  negative psych ROS   GI/Hepatic negative GI ROS, Neg liver ROS,   Endo/Other  negative endocrine ROSdiabetes, Well Controlled, Type 2  Renal/GU negative Renal ROS  negative genitourinary   Musculoskeletal negative musculoskeletal ROS (+)   Abdominal Normal abdominal exam  (+)   Peds negative pediatric ROS (+)  Hematology negative hematology ROS (+)   Anesthesia Other Findings Past Medical History: No date: Acute medial meniscus tear No date: DM (diabetes mellitus), type 2 (HCC) No date: Gout No date: Hyperlipemia No date: Hypertension No date: Over weight No date: Sedentary lifestyle No date: Sleep apnea     Comment:  no cpap No date: Vertigo No date: Vertigo  Past Surgical History: No date: COLONOSCOPY 07/24/2018: INGUINAL HERNIA REPAIR; Left     Comment:  Procedure: HERNIA REPAIR INGUINAL ADULT, LEFT OPEN;                Surgeon: Robert Bellow, MD;  Location: [facility] ORS;                Service: General;  Laterality: Left;     Reproductive/Obstetrics negative OB ROS                             Anesthesia Physical Anesthesia Plan  ASA: 2  Anesthesia Plan: General   Post-op Pain Management:    Induction: Intravenous  PONV Risk Score and Plan: Propofol infusion and TIVA  Airway  Management Planned: Natural Airway and Nasal Cannula  Additional Equipment:   Intra-op Plan:   Post-operative Plan:   Informed Consent: I have reviewed the patients History and Physical, chart, labs and discussed the procedure including the risks, benefits and alternatives for the proposed anesthesia with the patient or authorized representative who has indicated his/her understanding and acceptance.     Dental Advisory Given  Plan Discussed with: CRNA and Surgeon  Anesthesia Plan Comments:         Anesthesia Quick Evaluation

## 2021-02-11 NOTE — Op Note (Signed)
[facility] Patient Name: Tom Edwards Procedure Date: 02/11/2021 12:50 PM MRN: [id number] Account #: [id number] Date of Birth: June 15, 1960 Admit Type: Outpatient Age: 61 Room: [facility] ENDO ROOM 3 Gender: Male Note Status: Finalized Instrument Name: Park Meo [id number] Procedure:             Colonoscopy Indications:           Screening for colorectal malignant neoplasm Providers:             Andrey Farmer MD, MD Referring MD:          No Local Md, MD (Referring MD) Medicines:             Monitored Anesthesia Care Complications:         No immediate complications. Estimated blood loss:                         Minimal. Procedure:             Pre-Anesthesia Assessment:                        - Prior to the procedure, a History and Physical was                         performed, and patient medications and allergies were                         reviewed. The patient is competent. The risks and                         benefits of the procedure and the sedation options and                         risks were discussed with the patient. All questions                         were answered and informed consent was obtained.                         Patient identification and proposed procedure were                         verified by the physician, the nurse, the                         anesthesiologist, the anesthetist and the technician                         in the endoscopy suite. Mental Status Examination:                         alert and oriented. Airway Examination: normal                         oropharyngeal airway and neck mobility. Respiratory                         Examination: clear to auscultation. CV Examination:  normal. Prophylactic Antibiotics: The patient does not                         require prophylactic antibiotics. Prior                         Anticoagulants: The patient has taken no previous                          anticoagulant or antiplatelet agents. ASA Grade                         Assessment: II - A patient with mild systemic disease.                         After reviewing the risks and benefits, the patient                         was deemed in satisfactory condition to undergo the                         procedure. The anesthesia plan was to use monitored                         anesthesia care (MAC). Immediately prior to                         administration of medications, the patient was                         re-assessed for adequacy to receive sedatives. The                         heart rate, respiratory rate, oxygen saturations,                         blood pressure, adequacy of pulmonary ventilation, and                         response to care were monitored throughout the                         procedure. The physical status of the patient was                         re-assessed after the procedure.                        After obtaining informed consent, the colonoscope was                         passed under direct vision. Throughout the procedure,                         the patient's blood pressure, pulse, and oxygen                         saturations were monitored continuously. The  Colonoscope was introduced through the anus and                         advanced to the the cecum, identified by appendiceal                         orifice and ileocecal valve. The colonoscopy was                         performed without difficulty. The patient tolerated                         the procedure well. The quality of the bowel                         preparation was good. Findings:      The perianal and digital rectal examinations were normal.      A 1 mm polyp was found in the hepatic flexure. The polyp was sessile.       The polyp was removed with a jumbo cold forceps. Resection and retrieval       were complete. Estimated blood loss was  minimal.      A few small-mouthed diverticula were found in the sigmoid colon.      Internal hemorrhoids were found during retroflexion. The hemorrhoids       were Grade I (internal hemorrhoids that do not prolapse).      The exam was otherwise without abnormality on direct and retroflexion       views. Impression:            - One 1 mm polyp at the hepatic flexure, removed with                         a jumbo cold forceps. Resected and retrieved.                        - Diverticulosis in the sigmoid colon.                        - Internal hemorrhoids.                        - The examination was otherwise normal on direct and                         retroflexion views. Recommendation:        - Discharge patient to home.                        - Resume previous diet.                        - Continue present medications.                        - Await pathology results.                        - Repeat colonoscopy in 7 years for surveillance.                        -  Return to referring physician as previously                         scheduled. Procedure Code(s):     --- Professional ---                        [phone], Colonoscopy, flexible; with biopsy, single or                         multiple Diagnosis Code(s):     --- Professional ---                        Z12.11, Encounter for screening for malignant neoplasm                         of colon                        K63.5, Polyp of colon                        K64.0, First degree hemorrhoids                        K57.30, Diverticulosis of large intestine without                         perforation or abscess without bleeding CPT copyright 2019 [employer]. All rights reserved. The codes documented in this report are preliminary and upon coder review may  be revised to meet current compliance requirements. Andrey Farmer MD, MD 02/11/2021 1:28:25 PM Number of Addenda: 0 Note Initiated On: 02/11/2021 12:50  PM Scope Withdrawal Time: 0 hours 8 minutes 18 seconds  Total Procedure Duration: 0 hours 14 minutes 35 seconds  Estimated Blood Loss:  Estimated blood loss was minimal.      [facility]

## 2021-02-14 ENCOUNTER — Encounter: Payer: Self-pay | Admitting: Gastroenterology

## 2021-02-14 LAB — SURGICAL PATHOLOGY

## 2021-02-21 ENCOUNTER — Other Ambulatory Visit: Payer: Self-pay

## 2021-02-21 ENCOUNTER — Ambulatory Visit: Payer: Self-pay

## 2021-02-21 DIAGNOSIS — Z0000 Encounter for general adult medical examination without abnormal findings: Secondary | ICD-10-CM

## 2021-02-21 LAB — POCT URINALYSIS DIPSTICK
Bilirubin, UA: NEGATIVE
Blood, UA: NEGATIVE
Glucose, UA: NEGATIVE
Ketones, UA: NEGATIVE
Leukocytes, UA: NEGATIVE
Nitrite, UA: NEGATIVE
Protein, UA: NEGATIVE
Spec Grav, UA: >=1.03 — AB (ref 1.010–1.025)
Urobilinogen, UA: 0.2 E.U./dL
pH, UA: 6 (ref 5.0–8.0)

## 2021-02-21 NOTE — Progress Notes (Signed)
02/28/21 annual physical scheduled.

## 2021-02-22 LAB — CMP12+LP+TP+TSH+6AC+PSA+CBC…
ALT: 24 IU/L (ref 0–44)
AST: 24 IU/L (ref 0–40)
Albumin/Globulin Ratio: 2 (ref 1.2–2.2)
Albumin: 4.9 g/dL (ref 3.8–4.9)
Alkaline Phosphatase: 72 IU/L (ref 44–121)
BUN/Creatinine Ratio: 10 (ref 10–24)
BUN: 12 mg/dL (ref 8–27)
Basophils Absolute: 0 10*3/uL (ref 0.0–0.2)
Basos: 1 %
Bilirubin Total: 0.9 mg/dL (ref 0.0–1.2)
Calcium: 9.4 mg/dL (ref 8.6–10.2)
Chloride: 103 mmol/L (ref 96–106)
Chol/HDL Ratio: 3.8 ratio (ref 0.0–5.0)
Cholesterol, Total: 158 mg/dL (ref 100–199)
Creatinine, Ser: 1.21 mg/dL (ref 0.76–1.27)
EOS (ABSOLUTE): 0.1 10*3/uL (ref 0.0–0.4)
Eos: 1 %
Estimated CHD Risk: 0.7 times avg. (ref 0.0–1.0)
Free Thyroxine Index: 1.3 (ref 1.2–4.9)
GGT: 27 IU/L (ref 0–65)
Globulin, Total: 2.5 g/dL (ref 1.5–4.5)
Glucose: 166 mg/dL — ABNORMAL HIGH (ref 70–99)
HDL: 42 mg/dL (ref >39)
Hematocrit: 41.5 % (ref 37.5–51.0)
Hemoglobin: 14.3 g/dL (ref 13.0–17.7)
Immature Grans (Abs): 0 10*3/uL (ref 0.0–0.1)
Immature Granulocytes: 0 %
Iron: 125 ug/dL (ref 38–169)
LDH: 209 IU/L (ref 121–224)
LDL Chol Calc (NIH): 96 mg/dL (ref 0–99)
Lymphocytes Absolute: 2.6 10*3/uL (ref 0.7–3.1)
Lymphs: 47 %
MCH: 32.3 pg (ref 26.6–33.0)
MCHC: 34.5 g/dL (ref 31.5–35.7)
MCV: 94 fL (ref 79–97)
Monocytes Absolute: 0.4 10*3/uL (ref 0.1–0.9)
Monocytes: 7 %
Neutrophils Absolute: 2.5 10*3/uL (ref 1.4–7.0)
Neutrophils: 44 %
Phosphorus: 4.3 mg/dL — ABNORMAL HIGH (ref 2.8–4.1)
Platelets: 209 10*3/uL (ref 150–450)
Potassium: 4 mmol/L (ref 3.5–5.2)
Prostate Specific Ag, Serum: 5 ng/mL — ABNORMAL HIGH (ref 0.0–4.0)
RBC: 4.43 x10E6/uL (ref 4.14–5.80)
RDW: 13 % (ref 11.6–15.4)
Sodium: 140 mmol/L (ref 134–144)
T3 Uptake Ratio: 28 % (ref 24–39)
T4, Total: 4.6 ug/dL (ref 4.5–12.0)
TSH: 1.7 u[IU]/mL (ref 0.450–4.500)
Total Protein: 7.4 g/dL (ref 6.0–8.5)
Triglycerides: 110 mg/dL (ref 0–149)
Uric Acid: 4.2 mg/dL (ref 3.8–8.4)
VLDL Cholesterol Cal: 20 mg/dL (ref 5–40)
WBC: 5.6 10*3/uL (ref 3.4–10.8)
eGFR: 69 mL/min/{1.73_m2} (ref >59)

## 2021-02-22 LAB — HGB A1C W/O EAG: Hgb A1c MFr Bld: 7 % — ABNORMAL HIGH (ref 4.8–5.6)

## 2021-02-28 ENCOUNTER — Ambulatory Visit: Payer: Self-pay | Admitting: Physician Assistant

## 2021-02-28 ENCOUNTER — Ambulatory Visit
Admission: RE | Admit: 2021-02-28 | Discharge: 2021-02-28 | Disposition: A | Discharge status: Home / Self Care | Payer: 59 | Source: Ambulatory Visit | Attending: Physician Assistant | Admitting: Physician Assistant

## 2021-02-28 ENCOUNTER — Other Ambulatory Visit: Payer: Self-pay

## 2021-02-28 ENCOUNTER — Encounter: Payer: Self-pay | Admitting: Physician Assistant

## 2021-02-28 ENCOUNTER — Ambulatory Visit
Admission: RE | Admit: 2021-02-28 | Discharge: 2021-02-28 | Disposition: A | Discharge status: Home / Self Care | Payer: 59 | Attending: Physician Assistant | Admitting: Physician Assistant

## 2021-02-28 VITALS — BP 142/90 | HR 80 | Temp 97.8°F | Resp 12 | Ht 68.0 in | Wt 201.0 lb

## 2021-02-28 DIAGNOSIS — M898X1 Other specified disorders of bone, shoulder: Secondary | ICD-10-CM | POA: Insufficient documentation

## 2021-02-28 DIAGNOSIS — E119 Type 2 diabetes mellitus without complications: Secondary | ICD-10-CM

## 2021-02-28 DIAGNOSIS — R972 Elevated prostate specific antigen [PSA]: Secondary | ICD-10-CM

## 2021-02-28 DIAGNOSIS — M19012 Primary osteoarthritis, left shoulder: Secondary | ICD-10-CM | POA: Diagnosis not present

## 2021-02-28 NOTE — Progress Notes (Signed)
[facility]  ____________________________________________   None    (approximate)  I have reviewed the triage vital signs and the nursing notes.   HISTORY  Chief Complaint Annual Exam    HPI Tom Edwards is a 61 y.o. male patient presents for annual physical exam.  Patient states there was confusion on the correct dosage of metformin.  He thought he was will be taken 500 mg once a day instead of twice a day.  Patient also states he has not taken morning medications prior to this appointment.      Past Medical History:  Diagnosis Date   Acute medial meniscus tear    DM (diabetes mellitus), type 2 ([place])    Gout    Hyperlipemia    Hypertension    Over weight    Sedentary lifestyle    Sleep apnea    no cpap   Vertigo    Vertigo     Patient Active Problem List   Diagnosis Date Noted   S/P inguinal hernia repair 08/07/2018   Inguinal hernia 07/16/2018    Past Surgical History:  Procedure Laterality Date   COLONOSCOPY     COLONOSCOPY WITH PROPOFOL N/A 02/11/2021   Procedure: COLONOSCOPY WITH PROPOFOL;  Surgeon: Lesly Rubenstein, MD;  Location: [facility] ENDOSCOPY;  Service: Endoscopy;  Laterality: N/A;   INGUINAL HERNIA REPAIR Left 07/24/2018   Procedure: HERNIA REPAIR INGUINAL ADULT, LEFT OPEN;  Surgeon: Robert Bellow, MD;  Location: [facility] ORS;  Service: General;  Laterality: Left;    Prior to Admission medications   Medication Sig Start Date End Date Taking? Authorizing Provider  allopurinol (ZYLOPRIM) 300 MG tablet Take 1 tablet (300 mg total) by mouth daily. 11/22/19  Yes Sable Feil, PA-C  amLODipine (NORVASC) 10 MG tablet Take 1 tablet (10 mg total) by mouth daily. 07/27/20  Yes Sable Feil, PA-C  atorvastatin (LIPITOR) 10 MG tablet Take 1 tablet (10 mg total) by mouth daily. 11/22/19  Yes Sable Feil, PA-C  bisoprolol (ZEBETA) 10 MG tablet Take 1 tablet (10 mg total) by mouth daily. 08/03/20  Yes Sable Feil, PA-C  metFORMIN  (GLUCOPHAGE) 500 MG tablet Take 1 tablet (500 mg total) by mouth 2 (two) times daily with a meal. 09/16/20  Yes Sable Feil, PA-C  amLODipine (NORVASC) 10 MG tablet Take 1 tablet (10 mg total) by mouth daily. 06/01/20   Ashley Murrain, NP  neomycin-polymyxin-pramoxine (NEOSPORIN PLUS) 1 % cream Apply topically 2 (two) times daily. 10/14/20   Sable Feil, PA-C    Allergies Patient has no known allergies.  Family History  Problem Relation Age of Onset   Diabetes Mother    Cerebrovascular Accident Mother    Prostate cancer Father    Hypertension Father    Diabetes Brother     Social History Social History   Tobacco Use   Smoking status: Some Days    Types: Cigars   Smokeless tobacco: Never  Vaping Use   Vaping Use: Never used  Substance Use Topics   Alcohol use: Yes    Alcohol/week: 10.0 standard drinks    Types: 7 Cans of beer, 3 Shots of liquor per week   Drug use: Never    Review of Systems Constitutional: No fever/chills Eyes: No visual changes. ENT: No sore throat. Cardiovascular: Denies chest pain. Respiratory: Denies shortness of breath. Gastrointestinal: No abdominal pain.  No nausea, no vomiting.  No diarrhea.  No constipation. Genitourinary: Negative for dysuria.  Musculoskeletal: Left proximal clavicle pain. Skin: Negative for rash. Neurological: Negative for headaches, focal weakness or numbness. Endocrine: Diabetes, hyperlipidemia, and hypertension. Hematological/Lymphatic:    ____________________________________________   PHYSICAL EXAM:  VITAL SIGNS: Temperature is 97.8, pulse 80, respiration 12, BP is 142/90, and patient 97% O2 sat on room air.  Patient weighs 201 pounds and BMI is 30.56. Constitutional: Alert and oriented. Well appearing and in no acute distress. Eyes: Conjunctivae are normal. PERRL. EOMI. Head: Atraumatic. Nose: No congestion/rhinnorhea. Mouth/Throat: Mucous membranes are moist.  Oropharynx non-erythematous. Neck: No stridor.   No cervical spine tenderness to palpation. Hematological/Lymphatic/Immunilogical: No cervical lymphadenopathy. Cardiovascular: Normal rate, regular rhythm. Grossly normal heart sounds.  Good peripheral circulation. Respiratory: Normal respiratory effort.  No retractions. Lungs CTAB. Gastrointestinal: Soft and nontender. No distention. No abdominal bruits. No CVA tenderness. Genitourinary:  Musculoskeletal: Increased bony growth in proximal left clavicle and compression to the right.  Moderate guarding with palpation.   Neurologic:  Normal speech and language. No gross focal neurologic deficits are appreciated. No gait instability. Skin:  Skin is warm, dry and intact. No rash noted. Psychiatric: Mood and affect are normal. Speech and behavior are normal.  ____________________________________________   LABS    POCT urinalysis dipstick (Order [id number])  Contains abnormal data POCT urinalysis dipstick Order: [id number] Status: Final result    Visible to patient: Yes (seen)    Next appt: 06/01/2021 at 08:15 AM in No Specialty ([facility] NURSE)    Dx: Routine adult health maintenance    0 Result Notes        Component Ref Range & Units 7 d ago (02/21/21) 1 yr ago (01/21/20) 2 yr ago (01/16/19) 2 yr ago (10/15/18)  Color, UA  honey  Yellow  Yellow  Yellow   Clarity, UA  clear  Clear  Clear  Clear   Glucose, UA Negative Negative  Negative  Negative  Negative   Bilirubin, UA  negative  Negative  Negative  Negative   Ketones, UA  negative  Negative  Negative  Negative   Spec Grav, UA 1.010 - 1.025 >=1.030 Abnormal   1.015  1.025  1.015   Blood, UA  negative  Negative  Negative  Negative   pH, UA 5.0 - 8.0 6.0  6.0  6.0  6.0   Protein, UA Negative Negative  Negative  Negative  Negative   Urobilinogen, UA 0.2 or 1.0 E.U./dL 0.2  0.2  0.2  0.2   Nitrite, UA  negative  Negative  Negative  Negative   Leukocytes, UA Negative Negative  Negative  Negative  Negative   Appearance  dark         Odor                  Specimen Collected: 02/21/21 08:54 Last Resulted: 02/21/21 08:54      Lab Flowsheet    Order Details    View Encounter    Lab and Collection Details    Routing    Result History    View Encounter Conversation        Result Care Coordination   Patient Communication   Add Comments   Seen Back to Top       Other Results from 02/21/2021   Contains abnormal data CMP12+LP+TP+TSH+6AC+PSA+CBC Order: [id number] Status: Final result    Visible to patient: Yes (seen)    Next appt: 06/01/2021 at 08:15 AM in No Specialty ([facility] NURSE)    Dx: Routine adult health maintenance    0 Result  Notes             Component Ref Range & Units 7 d ago (02/21/21) 1 yr ago (01/21/20) 1 yr ago (07/24/19) 2 yr ago (01/16/19) 2 yr ago (07/10/18) 3 yr ago (11/05/17) 3 yr ago (11/05/17) 3 yr ago (11/05/17) 3 yr ago (11/05/17)  Glucose 70 - 99 mg/dL 166 High   144 High  R   161 High  R  95 R       Uric Acid 3.8 - 8.4 mg/dL 4.2  5.6 CM   5.5 CM        Comment:            Therapeutic target for gout patients: <6.0  BUN 8 - 27 mg/dL 12  15 R  14 R  10 R  16 R       Creatinine, Ser 0.76 - 1.27 mg/dL 1.21  1.25  1.21  1.11  1.06       eGFR >59 mL/min/1.73 69           BUN/Creatinine Ratio 10 - '24 10  12 ' R  12 R  9 R  15 R       Sodium 134 - 144 mmol/L 140  139   139  142       Potassium 3.5 - 5.2 mmol/L 4.0  4.1   4.0  3.8       Chloride 96 - 106 mmol/L 103  104   104  107 High        Calcium 8.6 - 10.2 mg/dL 9.4  9.3 R   9.1 R  8.8 R       Phosphorus 2.8 - 4.1 mg/dL 4.3 High   4.1   4.7 High         Total Protein 6.0 - 8.5 g/dL 7.4  7.5   6.5        Albumin 3.8 - 4.9 g/dL 4.9  4.7   4.4        Globulin, Total 1.5 - 4.5 g/dL 2.5  2.8   2.1        Albumin/Globulin Ratio 1.2 - 2.2 2.0  1.7   2.1        Bilirubin Total 0.0 - 1.2 mg/dL 0.9  0.4   0.8        Alkaline Phosphatase 44 - 121 IU/L 72  84 CM   70 R        LDH 121 - 224 IU/L 209  190   185        AST 0 - 40 IU/L '24  21   27        ' ALT  0 - 44 IU/L '24  22   26        ' GGT 0 - 65 IU/L '27  23   19        ' Iron 38 - 169 ug/dL 125  68   85        Cholesterol, Total 100 - 199 mg/dL 158  138   135        Triglycerides 0 - 149 mg/dL 110  101   85      150 R   HDL >39 mg/dL 42  40   46      36 R   VLDL Cholesterol Cal 5 - 40 mg/dL '20  19   16        ' LDL Chol Calc (NIH) 0 - 99 mg/dL 96  79   73        Chol/HDL Ratio 0.0 - 5.0 ratio 3.8  3.5 CM   2.9 CM        Comment:                                   T. Chol/HDL Ratio                                              Men  Women                                1/2 Avg.Risk  3.4    3.3                                    Avg.Risk  5.0    4.4                                 2X Avg.Risk  9.6    7.1                                 3X Avg.Risk 23.4   11.0   Estimated CHD Risk 0.0 - 1.0 times avg. 0.7  0.5 CM    < 0.5 CM        Comment: The CHD Risk is based on the T. Chol/HDL ratio. Other  factors affect CHD Risk such as hypertension, smoking,  diabetes, severe obesity, and family history of  premature CHD.   TSH 0.450 - 4.500 uIU/mL 1.700  1.570   0.880     1.220 R    T4, Total 4.5 - 12.0 ug/dL 4.6  4.8   4.1 Low     4.1 R     T3 Uptake Ratio 24 - 39 % '28  31   29        ' Free Thyroxine Index 1.2 - 4.9 1.3  1.5   1.2   1.1 R      Prostate Specific Ag, Serum 0.0 - 4.0 ng/mL 5.0 High   3.8 CM   3.9 CM        Comment: Roche ECLIA methodology.  According to the American Urological Association, Serum PSA should  decrease and remain at undetectable levels after radical  prostatectomy. The AUA defines biochemical recurrence as an initial  PSA value 0.2 ng/mL or greater followed by a subsequent confirmatory  PSA value 0.2 ng/mL or greater.  Values obtained with different assay methods or kits cannot be used  interchangeably. Results cannot be interpreted as absolute evidence  of the presence or absence of malignant disease.   WBC 3.4 - 10.8 x10E3/uL 5.6  5.5   4.7        RBC 4.14 - 5.80  x10E6/uL 4.43  4.66   4.39        Hemoglobin 13.0 - 17.7 g/dL 14.3  14.6   14.1        Hematocrit 37.5 - 51.0 % 41.5  42.9   41.5  MCV 79 - 97 fL 94  92   95        MCH 26.6 - 33.0 pg 32.3  31.3   32.1        MCHC 31.5 - 35.7 g/dL 34.5  34.0   34.0        RDW 11.6 - 15.4 % 13.0  12.7   13.0        Platelets 150 - 450 x10E3/uL 209  192   176        Neutrophils Not Estab. % 44  43   46        Lymphs Not Estab. % 47  47   44        Monocytes Not Estab. % '7  8   7        ' Eos Not Estab. % '1  1   2        ' Basos Not Estab. % '1  1   1        ' Neutrophils Absolute 1.4 - 7.0 x10E3/uL 2.5  2.4   2.2        Lymphocytes Absolute 0.7 - 3.1 x10E3/uL 2.6  2.6   2.1        Monocytes Absolute 0.1 - 0.9 x10E3/uL 0.4  0.5   0.3        EOS (ABSOLUTE) 0.0 - 0.4 x10E3/uL 0.1  0.1   0.1        Basophils Absolute 0.0 - 0.2 x10E3/uL 0.0  0.0   0.0        Immature Granulocytes Not Estab. % 0  0   0        Immature Grans (Abs) 0.0 - 0.1 x10E3/uL 0.0  0.0   0.0        Resulting Agency  [facility]    [facility]       Narrative Performed byMaryan Puls Performed at:  [facility]  [address], [place], [place]  [id number]  Lab Director: Rush Farmer MD, Phone:  [phone]    Specimen Collected: 02/21/21 08:11 Last Resulted: 02/22/21 08:13      Lab Flowsheet    Order Details    View Encounter    Lab and Collection Details    Routing    Result History    View Encounter Conversation      CM=Additional comments  R=Reference range differs from displayed range      Result Care Coordination   Patient Communication   Add Comments   Seen Back to Top          Contains abnormal data Hgb A1c w/o eAG Order: [id number] Status: Final result    Visible to patient: Yes (seen)    Next appt: 06/01/2021 at 08:15 AM in No Specialty ([facility] NURSE)    0 Result Notes   1 HM Topic           Component Ref Range & Units 7 d ago (02/21/21) 7 mo ago (07/14/20) 1 yr  ago (01/21/20) 1 yr ago (07/24/19) 1 yr ago (04/24/19) 2 yr ago (01/16/19) 2 yr ago (07/10/18)  Hgb A1c MFr Bld 4.8 - 5.6 % 7.0 High   6.6 High  CM  6.8 High  CM  6.3 High  CM  5.3 R  5.9 High  CM  5.7 High  CM   Comment:          Prediabetes: 5.7 - 6.4  Diabetes: >6.4           Glycemic control for adults with diabetes: <7.0   HbA1c POC (<> result, manual entry)           Resulting Agency  [facility]  [facility]       Narrative Performed by: Maryan Puls Performed at:  [facility]  [address] [place], [place]  [id number]  Lab Director: Rush Farmer MD, Phone:  [phone]    Specimen Collected: 02/21/21 08:11 Last Resulted: 02/22/21 08:13      Lab Flowsheet    Order Details    View Encounter    Lab and Collection Details    Routing    Result History    View Encounter Conversation      CM=Additional comments  R=Reference range differs from displayed range      Result Care Coordination   Patient Communication   Add Comments   Seen Back to Top      Satisfied Health Maintenance Topics     Back to Top HEMOGLOBIN A1C (Every 6 Months)  Next due on 08/21/2021  Address Topic       Ordering Comments  Not on file   MyChart Results Release  MyChart Status: Active  Results Release    POCT urinalysis dipstick: Patient Communication   Add Comments   Seen   All Reviewers List  Sable Feil, PA-C on 02/22/2021 08:27  Sable Feil, PA-C on 02/21/2021 10:05   Encounter  View Encounter        Result Information  Flag: Abnormal Abnormal   Status: Final result (Collected: 02/21/2021 08:54) Provider Status: Reviewed    Order-Level Documents:  There are no order-level documents. View SmartLink Info  POCT urinalysis dipstick (Order #[id number]) on 02/21/21   Result Read / Acknowledged  User Time Read / Karoline Caldwell, PA-C 02/21/2021 10:05 AM    POCT urinalysis dipstick: Patient Communication    Add Comments   Seen   ____________________________________________  EKG  Sinus  Rhythm  -  Nonspecific T-abnormality.  77 bpm.  ABNORMAL  ____________________________________________   ____________________________________________   INITIAL IMPRESSION / ASSESSMENT AND PLAN / ED COURSE  As part of my medical decision making, I reviewed the following data within the [facility]      Discussed labs and EKG results with patient.  Patient will start taking metformin 500 mg twice daily as directed with for 2-monthfollow-up.  Patient PSA is elevated 5.0.  Patient is asymptomatic but a consult to urology will be generated.  Patient also will get an x-ray of the left clavicle.  We will follow-up patient's status post x-ray results.       ____________________________________________   FINAL CLINICAL IMPRESSION(S) / ED DIAGNOSES  Well exam.   ED Discharge Orders          Ordered    DG Clavicle Left        02/28/21 0925             Note:  This document was prepared using Dragon voice recognition software and may include unintentional dictation errors.

## 2021-02-28 NOTE — Addendum Note (Signed)
Addended by: Hazle Coca on: 02/28/2021 09:53 AM   Modules accepted: Orders

## 2021-03-01 LAB — MICROALBUMIN / CREATININE URINE RATIO
Creatinine, Urine: 176.3 mg/dL
Microalb/Creat Ratio: 9 mg/g creat (ref 0–29)
Microalbumin, Urine: 16.4 ug/mL

## 2021-03-07 ENCOUNTER — Other Ambulatory Visit: Payer: Self-pay

## 2021-03-07 ENCOUNTER — Ambulatory Visit
Admission: RE | Admit: 2021-03-07 | Discharge: 2021-03-07 | Disposition: A | Discharge status: Home / Self Care | Payer: 59 | Attending: Physician Assistant | Admitting: Physician Assistant

## 2021-03-07 ENCOUNTER — Ambulatory Visit
Admission: RE | Admit: 2021-03-07 | Discharge: 2021-03-07 | Disposition: A | Discharge status: Home / Self Care | Payer: 59 | Source: Ambulatory Visit | Attending: Physician Assistant | Admitting: Physician Assistant

## 2021-03-07 ENCOUNTER — Ambulatory Visit: Payer: Self-pay | Admitting: Physician Assistant

## 2021-03-07 ENCOUNTER — Encounter: Payer: Self-pay | Admitting: Physician Assistant

## 2021-03-07 VITALS — BP 150/101 | HR 86 | Temp 97.5°F | Resp 14 | Ht 69.0 in | Wt 201.0 lb

## 2021-03-07 DIAGNOSIS — R059 Cough, unspecified: Secondary | ICD-10-CM | POA: Diagnosis not present

## 2021-03-07 DIAGNOSIS — R051 Acute cough: Secondary | ICD-10-CM | POA: Diagnosis not present

## 2021-03-07 DIAGNOSIS — Z1152 Encounter for screening for COVID-19: Secondary | ICD-10-CM

## 2021-03-07 LAB — POCT INFLUENZA A/B
Influenza A, POC: NEGATIVE
Influenza B, POC: NEGATIVE

## 2021-03-07 LAB — POC COVID19 BINAXNOW: SARS Coronavirus 2 Ag: NEGATIVE

## 2021-03-07 MED ORDER — PSEUDOEPH-BROMPHEN-DM 30-2-10 MG/5ML PO SYRP: 5.0000 mL | ORAL_SOLUTION | Freq: Four times a day (QID) | ORAL | 0 refills | Status: DC | PRN

## 2021-03-07 NOTE — Progress Notes (Signed)
Pt presents today with cough and post nasal drainage since Friday. Pt states it gets worse when laying down and goes into a coughing fit. He does have a cough through out the day coughing up flem. He has had mucinex , alka-seltzer cough and flu and HBP cough medicine and can not get any relief.

## 2021-03-07 NOTE — Progress Notes (Signed)
° °  Subjective: Cough    Patient ID: Tom Edwards, male    DOB: 10-17-1960, 61 y.o.   MRN: [id number]  HPI Patient presents with 5 days of productive cough.  Cough is associated with nasal drainage and intermittent congestion.  Patient states wife developed the same symptoms today.  Patient tested negative for COVID-19 and flu this morning.  Denies fever/chills, body aches, or weakness.  Denies recent travel or known contact with COVID-19.   Review of Systems Diabetes, hyperlipidemia, and hypertension    Objective:   Physical Exam  No acute distress.  Temperature 97.5, pulse 86, respiration 14, BP is 150/101, and patient is 94% O2 sat on room air. HEENT is remarkable for edematous nasal turbinates with postnasal drainage.  Productive cough throughout exam.  Neck is supple without lymphadenopathy or bruits.  Lungs are clear to auscultation.  Heart regular rate and rhythm.      Assessment & Plan: Upper respiratory infection.  Further evaluation with chest x-ray today.  Patient given a prescription of Bromfed-DM and will follow-up for in 24 hours.

## 2021-03-08 ENCOUNTER — Encounter: Payer: Self-pay | Admitting: Urology

## 2021-03-08 ENCOUNTER — Other Ambulatory Visit
Admission: RE | Admit: 2021-03-08 | Discharge: 2021-03-08 | Disposition: A | Discharge status: Home / Self Care | Payer: 59 | Attending: Urology | Admitting: Urology

## 2021-03-08 ENCOUNTER — Ambulatory Visit (INDEPENDENT_AMBULATORY_CARE_PROVIDER_SITE_OTHER): Payer: 59 | Admitting: Urology

## 2021-03-08 VITALS — BP 159/103 | HR 88 | Ht 69.0 in | Wt 198.0 lb

## 2021-03-08 DIAGNOSIS — R972 Elevated prostate specific antigen [PSA]: Secondary | ICD-10-CM

## 2021-03-08 DIAGNOSIS — Z8042 Family history of malignant neoplasm of prostate: Secondary | ICD-10-CM | POA: Diagnosis not present

## 2021-03-08 NOTE — Patient Instructions (Signed)
Prostate Cancer Screening Prostate cancer screening is testing that is done to check for the presence of prostate cancer in men. The prostate gland is a walnut-sized gland that is located below the bladder and in front of the rectum in males. The function of the prostate is to add fluid to semen during ejaculation. Prostate cancer is one of the most common types of cancer in men. Who should have prostate cancer screening? Screening recommendations vary based on age and other risk factors, as well as between the professional organizations who make the recommendations. In general, screening is recommended if: You are age 61 to 32 and have an average risk for prostate cancer. You should talk with your health care provider about your need for screening and how often screening should be done. Because most prostate cancers are slow growing and will not cause death, screening in this age group is generally reserved for men who have a 14- to 15-year life expectancy. You are younger than age 61, and you have these risk factors: Having a father, brother, or uncle who has been diagnosed with prostate cancer. The risk is higher if your family member's cancer occurred at an early age or if you have multiple family members with prostate cancer at an early age. Being a male who is [place] or is of [place] or sub-Saharan African descent. In general, screening is not recommended if: You are younger than age 61. You are between the ages of 61 and 13 and you have no risk factors. You are 61 years of age or older. At this age, the risks that screening can cause are greater than the benefits that it may provide. If you are at high risk for prostate cancer, your health care provider may recommend that you have screenings more often or that you start screening at a younger age. How is screening for prostate cancer done? The recommended prostate cancer screening test is a blood test called the prostate-specific antigen (PSA)  test. PSA is a protein that is made in the prostate. As you age, your prostate naturally produces more PSA. Abnormally high PSA levels may be caused by: Prostate cancer. An enlarged prostate that is not caused by cancer (benign prostatic hyperplasia, or BPH). This condition is very common in older men. A prostate gland infection (prostatitis) or urinary tract infection. Certain medicines such as male hormones (like testosterone) or other medicines that raise testosterone levels. A rectal exam may be done as part of prostate cancer screening to help provide information about the size of your prostate gland. When a rectal exam is performed, it should be done after the PSA level is drawn to avoid any effect on the results. Depending on the PSA results, you may need more tests, such as: A physical exam to check the size of your prostate gland, if not done as part of screening. Blood and imaging tests. A procedure to remove tissue samples from your prostate gland for testing (biopsy). This is the only way to know for certain if you have prostate cancer. What are the benefits of prostate cancer screening? Screening can help to identify cancer at an early stage, before symptoms start and when the cancer can be treated more easily. There is a small chance that screening may lower your risk of dying from prostate cancer. The chance is small because prostate cancer is a slow-growing cancer, and most men with prostate cancer die from a different cause. What are the risks of prostate cancer screening? The main  risk of prostate cancer screening is diagnosing and treating prostate cancer that would never have caused any symptoms or problems. This is called overdiagnosisand overtreatment. PSA screening cannot tell you if your PSA is high due to cancer or a different cause. A prostate biopsy is the only procedure to diagnose prostate cancer. Even the results of a biopsy may not tell you if your cancer needs to be  treated. Slow-growing prostate cancer may not need any treatment other than monitoring, so diagnosing and treating it may cause unnecessary stress or other side effects. Questions to ask your health care provider When should I start prostate cancer screening? What is my risk for prostate cancer? How often do I need screening? What type of screening tests do I need? How do I get my test results? What do my results mean? Do I need treatment? Where to find more information The American Cancer Society: www.cancer.org American Urological Association: www.auanet.org Contact a health care provider if: You have difficulty urinating. You have pain when you urinate or ejaculate. You have blood in your urine or semen. You have pain in your back or in the area of your prostate. Summary Prostate cancer is a common type of cancer in men. The prostate gland is located below the bladder and in front of the rectum. This gland adds fluid to semen during ejaculation. Prostate cancer screening may identify cancer at an early stage, when the cancer can be treated more easily and is less likely to have spread to other areas of the body. The prostate-specific antigen (PSA) test is the recommended screening test for prostate cancer, but it has associated risks. Discuss the risks and benefits of prostate cancer screening with your health care provider. If you are age 61 or older, the risks that screening can cause are greater than the benefits that it may provide. This information is not intended to replace advice given to you by your health care provider. Make sure you discuss any questions you have with your health care provider. Document Revised: 06/21/2020 Document Reviewed: 06/21/2020 Elsevier Patient Education  2022 [employer].  Transrectal Ultrasound-Guided Prostate Biopsy A transrectal ultrasound-guided prostate biopsy is a procedure to remove samples of prostate tissue for testing. The prostate is a  walnut-sized gland that is located below the bladder and in front of the rectum. During this procedure, a small device (probe) is lubricated and put inside the rectum. The probe sends out sound waves that make a picture of the prostate and surrounding tissues (transrectal ultrasound). The images are used to help guide the process of removing the samples. The samples are taken to a lab to be checked for prostate cancer. This procedure is usually done to evaluate the prostate gland of men who have raised (elevated) levels of prostate-specific antigen (PSA), which can be a sign of prostate cancer or prostate enlargement related to aging (benign prostatic hyperplasia, or BPH). Tell a health care provider about: Any allergies you have. All medicines you are taking, including vitamins, herbs, eye drops, creams, and over-the-counter medicines. Any problems you or family members have had with anesthetic medicines. Any bleeding problems you have. Any surgeries you have had. Any medical conditions you have. Any prostate infections you have had. What are the risks? Generally, this is a safe procedure. However, problems may occur, including: Prostate infection. Bleeding from the rectum. Blood in the urine. Allergic reactions to medicines. Damage to surrounding structures such as blood vessels, organs, or muscles. Difficulty passing urine. Nerve damage. This is  usually temporary. What happens before the procedure? Medicines Ask your health care provider about: Changing or stopping your regular medicines. This is especially important if you are taking diabetes medicines or blood thinners. Taking medicines such as aspirin and ibuprofen. These medicines can thin your blood. Do not take these medicines unless your health care provider tells you to take them. Taking over-the-counter medicines, vitamins, herbs, and supplements. General instructions Follow instructions from your health care provider about  eating and drinking. In most instances, you will not need to stop eating and drinking completely before the procedure. You will be given an enema. During an enema, a liquid is injected into your rectum to clear out waste. You may have a blood or urine sample taken. Ask your health care provider what steps will be taken to help prevent infection. These steps may include: Washing skin with a germ-killing soap. Taking antibiotic medicine. If you will be going home right after the procedure, plan to have a responsible adult: Take you home from the hospital or clinic. You will not be allowed to drive. Care for you for the time you are told. What happens during the procedure?  An IV will be inserted into one of your veins. You will be given one or both of the following: A medicine to help you relax (sedative). A medicine to numb the area (local anesthetic). You will be placed on your left side, and your knees will be bent toward your chest. A probe with lubricated gel will be placed into your rectum, and images will be taken of your prostate and surrounding structures. Numbing medicine will be injected into your prostate. A biopsy needle will be inserted through your rectum or perineum and guided to your prostate using the ultrasound images. Prostate tissue samples will be removed, and the needle and probe will then be removed. The biopsy samples will be sent to a lab to be tested. The procedure may vary among health care providers and hospitals. What happens after the procedure? Your blood pressure, heart rate, breathing rate, and blood oxygen level will be monitored until you leave the hospital or clinic. You may have some discomfort in the rectal area. You will be given pain medicine as needed. If you were given a sedative during the procedure, it can affect you for several hours. Do not drive or operate machinery until your health care provider says that it is safe. It is up to you to get the  results of your procedure. Ask your health care provider, or the department that is doing the procedure, when your results will be ready. Keep all follow-up visits. This is important. Summary A transrectal ultrasound-guided biopsy removes samples of tissue from your prostate using ultrasound-guided sound waves to help guide the process. This procedure is usually done to evaluate the prostate gland of men who have raised (elevated) levels of prostate-specific antigen (PSA), which can be a sign of prostate cancer or prostate enlargement related to aging. After your procedure, you may feel some discomfort in the rectal area. Plan to have a responsible adult take you home from the hospital or clinic, and follow up with your health care provider for your results. This information is not intended to replace advice given to you by your health care provider. Make sure you discuss any questions you have with your health care provider. Document Revised: 06/21/2020 Document Reviewed: 06/21/2020 Elsevier Patient Education  [place].

## 2021-03-08 NOTE — Progress Notes (Signed)
03/08/21 10:04 AM   Tom Edwards [birth year] [id number]  CC: Elevated PSA, family history of prostate cancer  HPI: I saw Tom Edwards and his wife today for the above issues.  He is a healthy 61 year old male with diabetes, hypertension, history of open left inguinal hernia repair who was recently found to have a mildly elevated PSA of 5 on 02/21/2021.  This had increased from his baseline of approximately 3.5 over the last few years.  He has a family history of prostate cancer in his father who underwent a open prostatectomy in his 47s, and remains alive in his mid 35s currently.  He denies any significant urinary symptoms, denies any ED.  Notably, he underwent a colonoscopy 2 weeks prior to that PSA being drawn.   PMH: Past Medical History:  Diagnosis Date   Acute medial meniscus tear    DM (diabetes mellitus), type 2 ([place])    Gout    Hyperlipemia    Hypertension    Over weight    Sedentary lifestyle    Sleep apnea    no cpap   Vertigo    Vertigo     Surgical History: Past Surgical History:  Procedure Laterality Date   COLONOSCOPY     COLONOSCOPY WITH PROPOFOL N/A 02/11/2021   Procedure: COLONOSCOPY WITH PROPOFOL;  Surgeon: Lesly Rubenstein, MD;  Location: [facility] ENDOSCOPY;  Service: Endoscopy;  Laterality: N/A;   INGUINAL HERNIA REPAIR Left 07/24/2018   Procedure: HERNIA REPAIR INGUINAL ADULT, LEFT OPEN;  Surgeon: Robert Bellow, MD;  Location: [facility] ORS;  Service: General;  Laterality: Left;    Family History: Family History  Problem Relation Age of Onset   Diabetes Mother    Cerebrovascular Accident Mother    Prostate cancer Father    Hypertension Father    Diabetes Brother     Social History:  reports that he has been smoking cigars. He has been exposed to tobacco smoke. He has never used smokeless tobacco. He reports current alcohol use of about 10.0 standard drinks per week. He reports that he does not use drugs.  Physical Exam: BP (!) 159/103    Pulse 88     Ht 5\' 9"  (1.753 m)    Wt 198 lb (89.8 kg)    BMI 29.24 kg/m    Constitutional:  Alert and oriented, No acute distress. Cardiovascular: No clubbing, cyanosis, or edema. Respiratory: Normal respiratory effort, no increased work of breathing. GI: Abdomen is soft, nontender, nondistended, no abdominal masses GU: 30 g, smooth, no nodules or masses  Laboratory Data: Reviewed, see HPI  Pertinent Imaging: None to review  Assessment & Plan:   61 year old male with mildly elevated PSA of 5, family history of prostate cancer in his father treated with prostatectomy.  We reviewed the implications of an elevated PSA and the uncertainty surrounding it. In general, a man's PSA increases with age and is produced by both normal and cancerous prostate tissue. The differential diagnosis for elevated PSA includes BPH, prostate cancer, infection, recent intercourse/ejaculation, recent urethroscopic manipulation (foley placement/cystoscopy) or trauma, and prostatitis.   Management of an elevated PSA can include observation or prostate biopsy and we discussed this in detail. Our goal is to detect clinically significant prostate cancers, and manage with either active surveillance, surgery, or radiation for localized disease. Risks of prostate biopsy include bleeding, infection (including life threatening sepsis), pain, and lower urinary symptoms. Hematuria, hematospermia, and blood in the stool are all common after biopsy and can persist up  to 4 weeks.   Repeat PSA with reflex to free, call with results.  If remains elevated pursue prostate biopsy  Nickolas Madrid, MD 03/08/2021  [facility] [address] [place], [place] [address] [phone]

## 2021-03-09 ENCOUNTER — Other Ambulatory Visit: Payer: Self-pay | Admitting: Physician Assistant

## 2021-03-09 ENCOUNTER — Telehealth: Payer: Self-pay

## 2021-03-09 DIAGNOSIS — R972 Elevated prostate specific antigen [PSA]: Secondary | ICD-10-CM

## 2021-03-09 LAB — FPSA% REFLEX
% FREE PSA: 16.8 %
PSA, FREE: 0.74 ng/mL

## 2021-03-09 LAB — PSA (REFLEX TO FREE) (SERIAL): Prostate Specific Ag, Serum: 4.4 ng/mL — ABNORMAL HIGH (ref 0.0–4.0)

## 2021-03-09 MED ORDER — PSEUDOEPH-BROMPHEN-DM 30-2-10 MG/5ML PO SYRP: 5.0000 mL | ORAL_SOLUTION | Freq: Four times a day (QID) | ORAL | 0 refills | Status: DC | PRN

## 2021-03-09 NOTE — Telephone Encounter (Signed)
Called pt informed him of the information below. Pt voiced understanding. PSA ordered and scheduled.  ?

## 2021-03-09 NOTE — Telephone Encounter (Signed)
-----   Message from Billey Co, MD sent at 03/09/2021 12:55 PM EST ----- ?PSA down to 4.4 from 5.0.  I suspect this is elevated from his recent colonoscopy, and would recommend follow-up for repeat PSA with reflex to free in 3 months prior ? ?Nickolas Madrid, MD ?03/09/2021 ? ? ?

## 2021-05-06 IMAGING — US LEFT LOWER EXTREMITY SOFT TISSUE ULTRASOUND LIMITED
1 series · 12 of 12 positions shown · non-contrast
Comparison: None.

CLINICAL DATA: Left inguinal swelling for 3 weeks.

EXAM:
ULTRASOUND LEFT LOWER EXTREMITY LIMITED
TECHNIQUE: Ultrasound examination of the lower extremity soft tissues was
performed in the area of clinical concern.

[Series 1: left lower extremity soft tissue ultrasound limite · 0.07mm/px · 12 acquisitions, 12 frames shown]
[im 1/12]
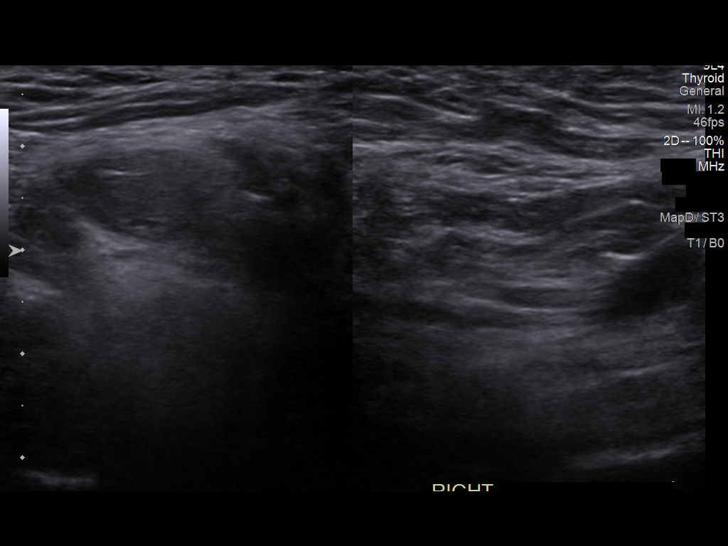
[im 2/12]
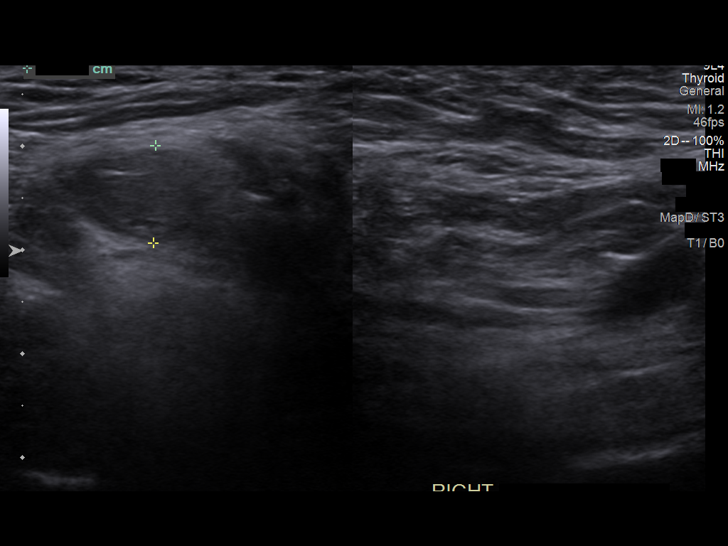
[im 3/12]
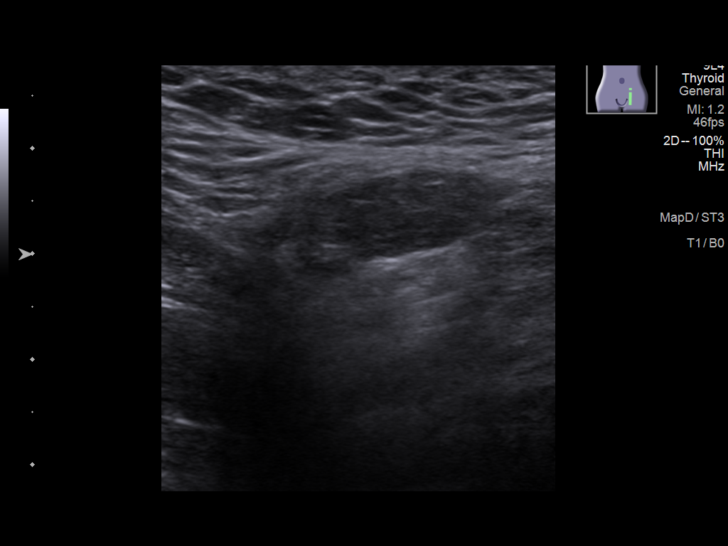
[im 4/12]
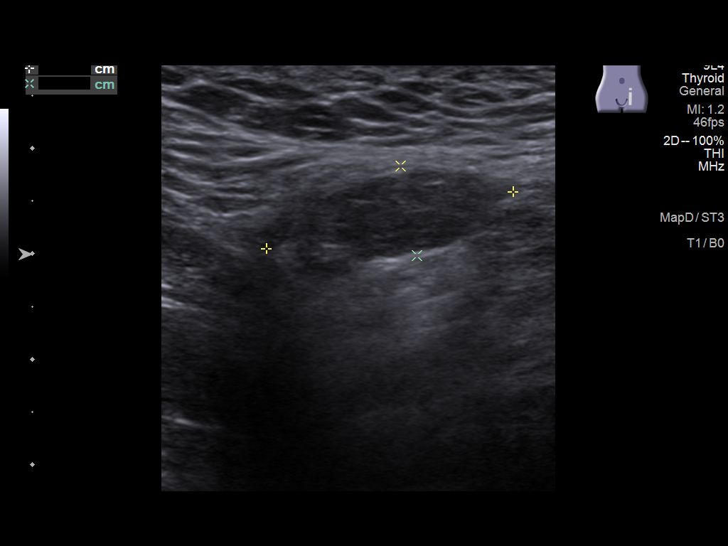
[im 5/12]
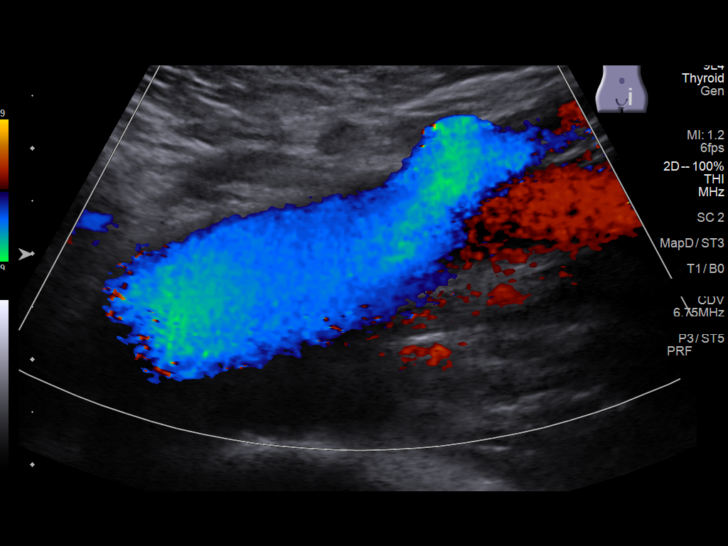
[im 6/12]
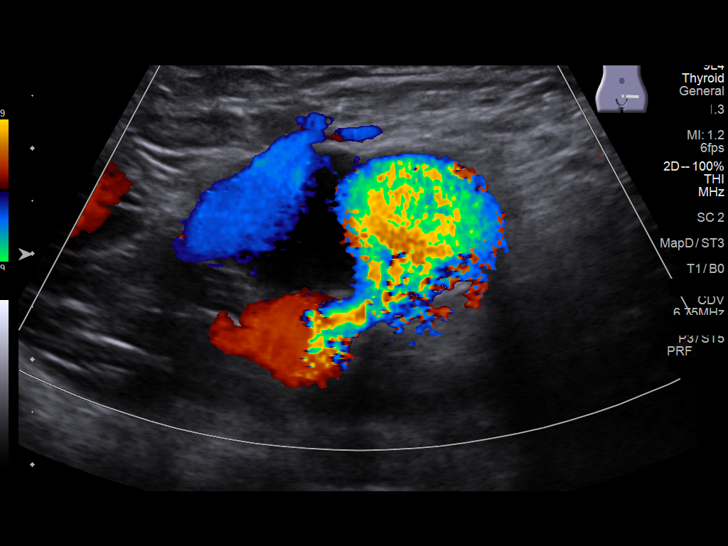
[im 7/12]
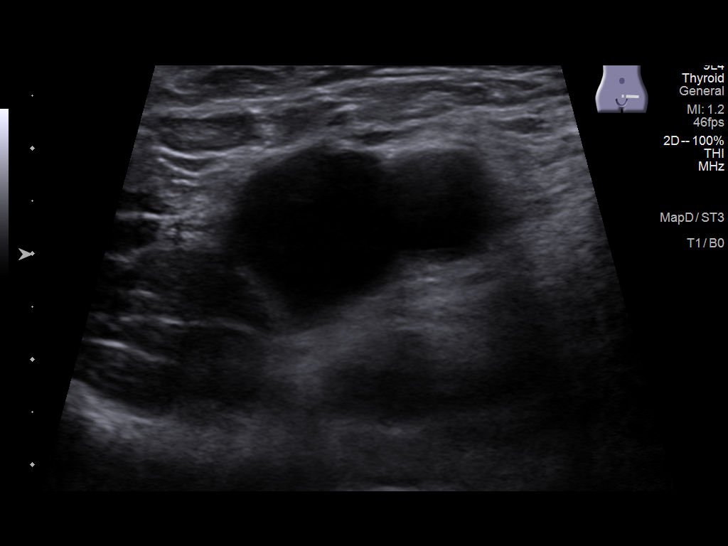
[im 8/12]
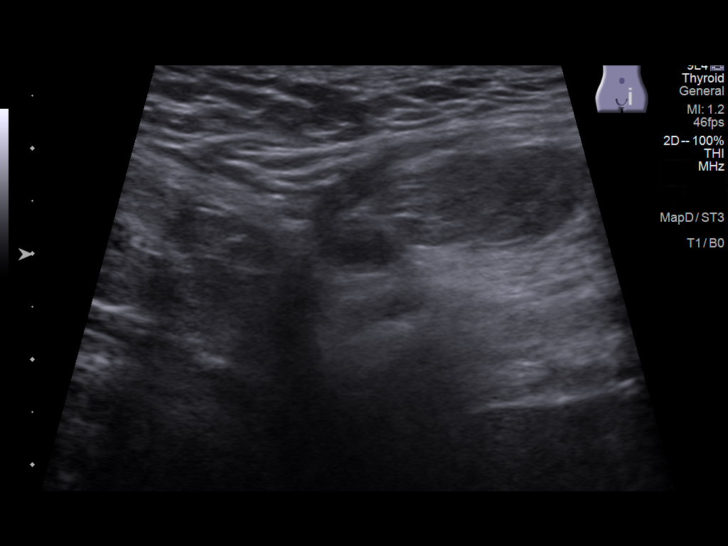
[im 9/12]
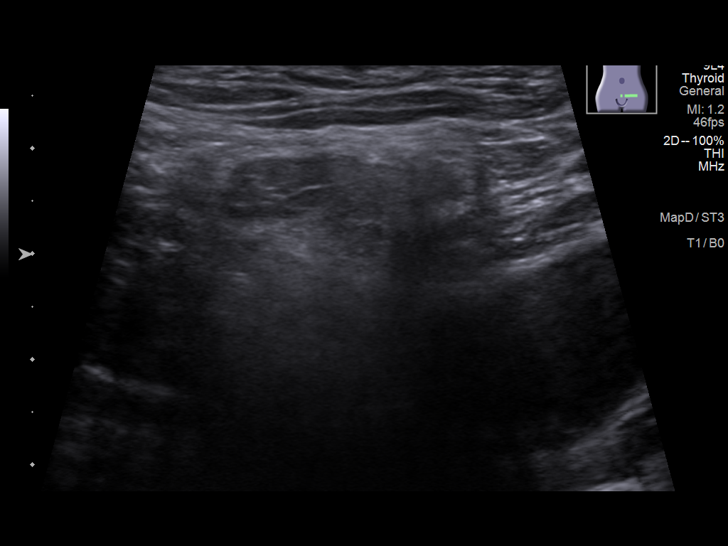
[im 10/12]
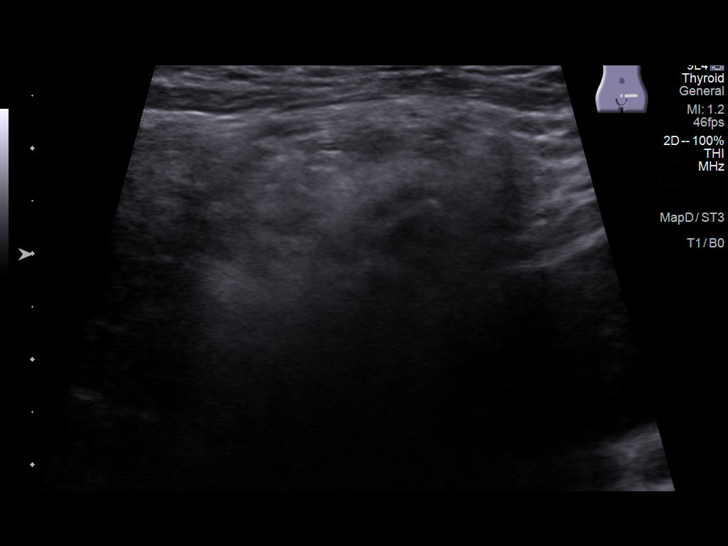
[im 11/12]
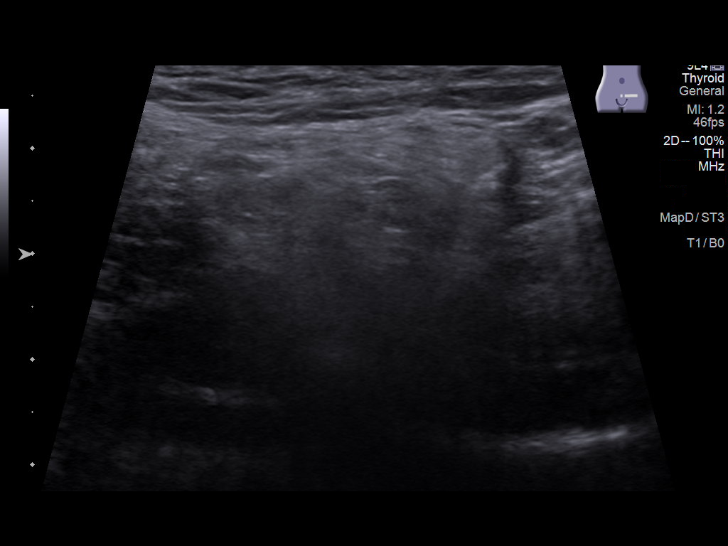
[im 12/12]
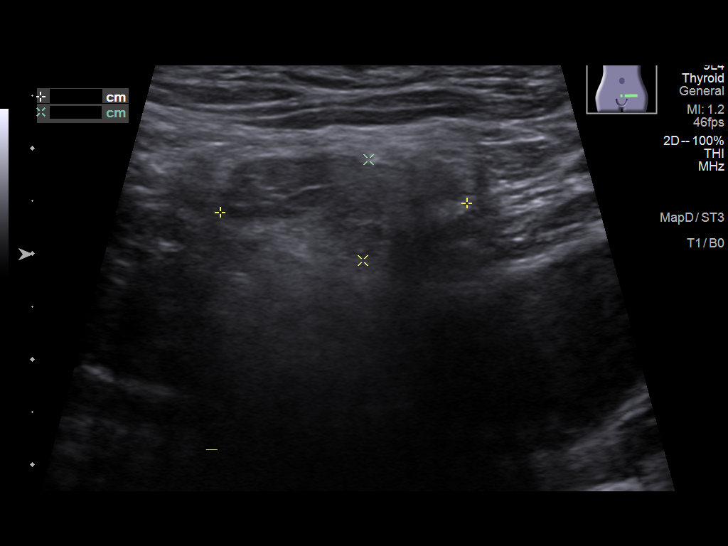

[12 of 12 positions shown; findings below may reference images not displayed]

FINDINGS: There is a small left inguinal hernia, which appears at least
partially reducible. There is a questionable bowel loop present
within the left inguinal hernia sac on the Valsalva cine images. No
fluid collections. No left inguinal adenopathy demonstrated.
IMPRESSION: Small left inguinal hernia is demonstrated, which appears at least
partially reducible. Questionable bowel loop present within the left
inguinal hernia sac. Consider further characterization with CT
abdomen/pelvis with oral and IV contrast.

## 2021-06-01 ENCOUNTER — Other Ambulatory Visit: Payer: Self-pay

## 2021-06-07 ENCOUNTER — Other Ambulatory Visit: Payer: Self-pay

## 2021-06-07 ENCOUNTER — Encounter: Payer: Self-pay | Admitting: Physician Assistant

## 2021-06-07 DIAGNOSIS — E119 Type 2 diabetes mellitus without complications: Secondary | ICD-10-CM

## 2021-06-07 DIAGNOSIS — R972 Elevated prostate specific antigen [PSA]: Secondary | ICD-10-CM

## 2021-06-07 LAB — POCT URINALYSIS DIPSTICK
Bilirubin, UA: NEGATIVE
Blood, UA: NEGATIVE
Glucose, UA: NEGATIVE
Ketones, UA: NEGATIVE
Leukocytes, UA: NEGATIVE
Nitrite, UA: NEGATIVE
Protein, UA: NEGATIVE
Spec Grav, UA: 1.01 (ref 1.010–1.025)
Urobilinogen, UA: 0.2 E.U./dL
pH, UA: 6 (ref 5.0–8.0)

## 2021-06-07 NOTE — Progress Notes (Signed)
Month follow up labs.

## 2021-06-08 LAB — HGB A1C W/O EAG: Hgb A1c MFr Bld: 6.7 % — ABNORMAL HIGH (ref 4.8–5.6)

## 2021-06-08 LAB — PSA: Prostate Specific Ag, Serum: 5.7 ng/mL — ABNORMAL HIGH (ref 0.0–4.0)

## 2021-06-09 ENCOUNTER — Other Ambulatory Visit: Payer: 59

## 2021-06-09 DIAGNOSIS — R972 Elevated prostate specific antigen [PSA]: Secondary | ICD-10-CM

## 2021-06-10 LAB — FPSA% REFLEX
% FREE PSA: 17.4 %
PSA, FREE: 1.01 ng/mL

## 2021-06-10 LAB — PSA TOTAL (REFLEX TO FREE): Prostate Specific Ag, Serum: 5.8 ng/mL — ABNORMAL HIGH (ref 0.0–4.0)

## 2021-06-15 ENCOUNTER — Telehealth: Payer: Self-pay

## 2021-06-15 DIAGNOSIS — R972 Elevated prostate specific antigen [PSA]: Secondary | ICD-10-CM

## 2021-06-15 NOTE — Telephone Encounter (Signed)
Called pt informed him of the information below. Pt gave verbal understanding, however declines scheduling prostate biopsy at this time. He would like to repeat PSA in 2-3 weeks and if elevated at this time he will proceed with the prostate biopsy. Strongly encouraged pt schedule prostate BX, pt declined. PSA ordered. Also of note pt is starting new job next week and is not eager to ask for time off. Advised pt we are happy to provide appropriate documentation of appointment for his job.

## 2021-06-15 NOTE — Telephone Encounter (Signed)
-----   Message from Billey Co, MD sent at 06/15/2021  8:15 AM EDT ----- PSA remains elevated, would recommend prostate biopsy.  Please review instructions and schedule prostate biopsy.    Thanks Nickolas Madrid, MD 06/15/2021

## 2021-08-17 ENCOUNTER — Other Ambulatory Visit: Payer: Self-pay

## 2021-08-17 DIAGNOSIS — I10 Essential (primary) hypertension: Secondary | ICD-10-CM

## 2021-08-17 MED ORDER — BISOPROLOL FUMARATE 10 MG PO TABS: 10.0000 mg | ORAL_TABLET | Freq: Every day | ORAL | 3 refills | Status: DC

## 2021-09-22 ENCOUNTER — Other Ambulatory Visit: Payer: 59

## 2021-09-22 ENCOUNTER — Other Ambulatory Visit: Payer: Self-pay

## 2021-09-22 DIAGNOSIS — R972 Elevated prostate specific antigen [PSA]: Secondary | ICD-10-CM

## 2021-09-23 LAB — PSA: Prostate Specific Ag, Serum: 4.9 ng/mL — ABNORMAL HIGH (ref 0.0–4.0)

## 2021-09-27 ENCOUNTER — Telehealth: Payer: Self-pay

## 2021-09-27 DIAGNOSIS — R972 Elevated prostate specific antigen [PSA]: Secondary | ICD-10-CM

## 2021-09-27 NOTE — Telephone Encounter (Signed)
-----   Message from Billey Co, MD sent at 09/27/2021  8:18 AM EDT ----- PSA remains elevated.  Recommend prostate biopsy as previously discussed.  Please review prostate biopsy instructions, and schedule prostate biopsy  Nickolas Madrid, MD 09/27/2021

## 2021-09-27 NOTE — Telephone Encounter (Signed)
Per Dr. Diamantina Providence  Please just document that we recommended prostate biopsy and he refused and opted for a repeat PSA.  Please make that a PSA with reflex to free, thank you  Spoke with patient and he would like to get just one more PSA and after that he will do the biopsy. Pt thought that if his number are going down then he may not have prostate cancer and not need the biopsy.  Lab appt schedule.

## 2021-09-27 NOTE — Addendum Note (Signed)
Addended by: Despina Hidden on: 09/27/2021 04:44 PM   Modules accepted: Orders

## 2021-09-27 NOTE — Telephone Encounter (Signed)
Called and spoke with pt does want schedule bx at this time without discuss this first. Pt feels like his number are going down, and want discuss having waiting or other options before having this done. Made an appt for come to discuss. Pt needed a later appt because of his work schedule.

## 2021-10-04 ENCOUNTER — Other Ambulatory Visit: Payer: 59

## 2021-10-04 DIAGNOSIS — R972 Elevated prostate specific antigen [PSA]: Secondary | ICD-10-CM | POA: Diagnosis not present

## 2021-10-05 ENCOUNTER — Telehealth: Payer: Self-pay | Admitting: *Deleted

## 2021-10-05 ENCOUNTER — Encounter: Payer: Self-pay | Admitting: *Deleted

## 2021-10-05 ENCOUNTER — Other Ambulatory Visit: Payer: Self-pay | Admitting: Urology

## 2021-10-05 LAB — FPSA% REFLEX
% FREE PSA: 17.1 %
PSA, FREE: 0.84 ng/mL

## 2021-10-05 LAB — PSA TOTAL (REFLEX TO FREE): Prostate Specific Ag, Serum: 4.9 ng/mL — ABNORMAL HIGH (ref 0.0–4.0)

## 2021-10-05 MED ORDER — DIAZEPAM 5 MG PO TABS: 5.0000 mg | ORAL_TABLET | Freq: Once | ORAL | 0 refills | Status: DC | PRN

## 2021-10-05 NOTE — Telephone Encounter (Signed)
Pt is scheduled for prostate biopsy 11/02/21 and asking for medication to help him relax.

## 2021-10-05 NOTE — Telephone Encounter (Signed)
Spoke with patient and advised results   

## 2021-10-07 ENCOUNTER — Other Ambulatory Visit: Payer: Self-pay

## 2021-10-07 DIAGNOSIS — I10 Essential (primary) hypertension: Secondary | ICD-10-CM

## 2021-10-07 DIAGNOSIS — E785 Hyperlipidemia, unspecified: Secondary | ICD-10-CM

## 2021-10-07 MED ORDER — AMLODIPINE BESYLATE 10 MG PO TABS: 10.0000 mg | ORAL_TABLET | Freq: Every day | ORAL | 3 refills | Status: DC

## 2021-10-20 ENCOUNTER — Ambulatory Visit: Payer: 59 | Admitting: Urology

## 2021-10-24 ENCOUNTER — Other Ambulatory Visit: Payer: Self-pay

## 2021-10-24 DIAGNOSIS — E119 Type 2 diabetes mellitus without complications: Secondary | ICD-10-CM

## 2021-10-25 MED ORDER — METFORMIN HCL 500 MG PO TABS: 500.0000 mg | ORAL_TABLET | Freq: Two times a day (BID) | ORAL | 3 refills | Status: DC

## 2021-11-02 ENCOUNTER — Ambulatory Visit (INDEPENDENT_AMBULATORY_CARE_PROVIDER_SITE_OTHER): Payer: 59 | Admitting: Urology

## 2021-11-02 ENCOUNTER — Encounter: Payer: Self-pay | Admitting: Urology

## 2021-11-02 VITALS — Ht 69.0 in | Wt 198.0 lb

## 2021-11-02 DIAGNOSIS — Z7689 Persons encountering health services in other specified circumstances: Secondary | ICD-10-CM

## 2021-11-02 DIAGNOSIS — C61 Malignant neoplasm of prostate: Secondary | ICD-10-CM | POA: Diagnosis not present

## 2021-11-02 DIAGNOSIS — R972 Elevated prostate specific antigen [PSA]: Secondary | ICD-10-CM

## 2021-11-02 DIAGNOSIS — N4232 Atypical small acinar proliferation of prostate: Secondary | ICD-10-CM | POA: Diagnosis not present

## 2021-11-02 MED ORDER — LEVOFLOXACIN 500 MG PO TABS
500.0000 mg | ORAL_TABLET | Freq: Once | ORAL | Status: AC
  Administered 2021-11-09: 500 mg via ORAL

## 2021-11-02 MED ORDER — GENTAMICIN SULFATE 40 MG/ML IJ SOLN
80.0000 mg | Freq: Once | INTRAMUSCULAR | Status: AC
  Administered 2021-11-02: 80 mg via INTRAMUSCULAR

## 2021-11-02 NOTE — Progress Notes (Signed)
   11/02/21  Indication: Elevated PSA, 4.9(17% free)  Prostate Biopsy Procedure   Informed consent was obtained, and we discussed the risks of bleeding and infection/sepsis. A time out was performed to ensure correct patient identity.  Pre-Procedure:  - Gentamicin and levaquin given for antibiotic prophylaxis - Transrectal Ultrasound performed revealing a 26 gm prostate, PSA density 0.19 - No significant hypoechoic or median lobe noted  Procedure: - Prostate block performed using 10 cc 1% lidocaine and biopsies taken from sextant areas, a total of 12 under ultrasound guidance.  Post-Procedure: - Patient tolerated the procedure well - He was counseled to seek immediate medical attention if experiences significant bleeding, fevers, or severe pain - Return in one week to discuss biopsy results  Assessment/ Plan: Will follow up in 1-2 weeks to discuss pathology  Nickolas Madrid, MD 11/02/2021

## 2021-11-02 NOTE — Patient Instructions (Signed)

## 2021-11-04 ENCOUNTER — Telehealth: Payer: Self-pay

## 2021-11-04 LAB — SURGICAL PATHOLOGY

## 2021-11-04 NOTE — Telephone Encounter (Signed)
Tom Edwards states via secure chat the following-  This is a patient of Dr. Diamantina Providence. 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   Results printed and viewed by [facility] Ok for results to wait until Diamantina Providence returns next week.

## 2021-11-09 ENCOUNTER — Encounter: Payer: Self-pay | Admitting: Urology

## 2021-11-09 ENCOUNTER — Ambulatory Visit (INDEPENDENT_AMBULATORY_CARE_PROVIDER_SITE_OTHER): Payer: 59 | Admitting: Urology

## 2021-11-09 VITALS — Ht 69.0 in | Wt 198.0 lb

## 2021-11-09 DIAGNOSIS — C61 Malignant neoplasm of prostate: Secondary | ICD-10-CM

## 2021-11-09 NOTE — Patient Instructions (Signed)
Prostate Cancer  The prostate is a small gland that produces fluid that makes up semen (seminal fluid). It is located below the bladder in men, in front of the rectum. Prostate cancer is the abnormal growth of cells in the prostate gland. What are the causes? The exact cause of this condition is not known. What increases the risk? You are more likely to develop this condition if: You are 61 years of age or older. You have a family history of prostate cancer. You have a family history of breast and ovarian cancer. You have genes that are passed from parent to child (inherited), such as BRCA1 and BRCA2. You have Lynch syndrome. African American men and men of African descent are diagnosed with prostate cancer at higher rates than other men. The reasons for this are not well understood and are likely due to a combination of genetic and environmental factors. What are the signs or symptoms? Symptoms of this condition include: Problems with urination. This may include: A weak or interrupted flow of urine. Trouble starting or stopping urination. Trouble emptying the bladder all the way. The need to urinate more often, especially at night. Blood in urine or semen. Persistent pain or discomfort in the lower back, lower abdomen, or hips. Trouble getting an erection. Weakness or numbness in the legs or feet. How is this diagnosed? This condition can be diagnosed with: A digital rectal exam. For this exam, a health care provider inserts a gloved finger into the rectum to feel the prostate gland. A blood test called a prostate-specific antigen (PSA) test. A procedure in which a sample of tissue is taken from the prostate and checked under a microscope (prostate biopsy). An imaging test called transrectal ultrasonography. Once the condition is diagnosed, tests will be done to determine how far the cancer has spread. This is called staging the cancer. Staging may involve imaging tests, such as a bone  scan, CT scan, PET scan, or MRI. Stages of prostate cancer The stages of prostate cancer are as follows: Stage 1 (I). At this stage, the cancer is found in the prostate only. The cancer is not visible on imaging tests, and it is usually found by accident, such as during prostate surgery. Stage 2 (II). At this stage, the cancer is more advanced than it is in stage 1, but the cancer has not spread outside the prostate. Stage 3 (III). At this stage, the cancer has spread beyond the outer layer of the prostate to nearby tissues. The cancer may be found in the seminal vesicles, which are near the bladder and the prostate. Stage 4 (IV). At this stage, the cancer has spread to other parts of the body, such as the lymph nodes, bones, bladder, rectum, liver, or lungs. Prostate cancer grading Prostate cancer is also graded according to how the cancer cells look under a microscope. This is called the Gleason score and the total score can range from 6-10, indicating how likely it is that the cancer will spread (metastasize) to other parts of the body. The higher the score, the greater the likelihood that the cancer will spread. Gleason 6 or lower: This indicates that the cancer cells look similar to normal prostate cells (well differentiated). Gleason 7: This indicates that the cancer cells look somewhat similar to normal prostate cells (moderately differentiated). Gleason 8, 9, or 10: This indicates that the cancer cells look very different than normal prostate cells (poorly differentiated). How is this treated? Treatment for this condition depends on several  factors, including the stage of the cancer, your age, personal preferences, and your overall health. Talk with your health care provider about treatment options that are recommended for you. Common treatments include: Observation for early stage prostate cancer (active surveillance). This involves having exams, blood tests, and in some cases, more biopsies.  For some men, this is the only treatment needed. Surgery. Types of surgeries include: Open surgery (radical prostatectomy). In this surgery, a larger incision is made to remove the prostate. A laparoscopic radical prostatectomy. This is a surgery to remove the prostate and lymph nodes through several small incisions. It is often referred to as a minimally invasive surgery. A robotic radical prostatectomy. This is laparoscopic surgery to remove the prostate and lymph nodes with the help of robotic arms that are controlled by the surgeon. Cryoablation. This is surgery to freeze and destroy cancer cells. Radiation treatment. Types of radiation treatment include: External beam radiation. This type aims beams of radiation from outside the body at the prostate to destroy cancerous cells. Brachytherapy. This type uses radioactive needles, seeds, wires, or tubes that are implanted into the prostate gland. Like external beam radiation, brachytherapy destroys cancerous cells. An advantage is that this type of radiation limits the damage to surrounding tissue and has fewer side effects. Chemotherapy. This treatment kills cancer cells or stops them from multiplying. It kills both cancer cells and normal cells. Targeted therapy. This treatment uses medicines to kill cancer cells without damaging normal cells. Hormone treatment. This treatment involves taking medicines that act on testosterone, one of the male hormones, by: Stopping your body from producing testosterone. Blocking testosterone from reaching cancer cells. Follow these instructions at home: Lifestyle Do not use any products that contain nicotine or tobacco. These products include cigarettes, chewing tobacco, and vaping devices, such as e-cigarettes. If you need help quitting, ask your health care provider. Eat a healthy diet. To do this: Eat foods that are high in fiber. These include beans, whole grains, and fresh fruits and vegetables. Limit  foods that are high in fat and sugar. These include fried or sweet foods. Treatment for prostate cancer may affect sexual function. If you have a partner, continue to have intimate moments. This may include touching, holding, hugging, and caressing your partner. Get plenty of sleep. Consider joining a support group for men who have prostate cancer. Meeting with a support group may help you learn to manage the stress of having cancer. General instructions Take over-the-counter and prescription medicines only as told by your health care provider. If you have to go to the hospital, notify your cancer specialist (oncologist). Keep all follow-up visits. This is important. Where to find more information American Cancer Society: www.cancer.[place] [employer]: www.cancer.net [employer]: www.cancer.gov Contact a health care provider if: You have new or increasing trouble urinating. You have new or increasing blood in your urine. You have new or increasing pain in your hips, back, or chest. Get help right away if: You have weakness or numbness in your legs. You cannot control urination or your bowel movements (incontinence). You have chills or a fever. Summary The prostate is a small gland that is involved in the production of semen. It is located below a man's bladder, in front of the rectum. Prostate cancer is the abnormal growth of cells in the prostate gland. Treatment for this condition depends on the stage of the cancer, your age, personal preferences, and your overall health. Talk with your health care provider about  treatment options that are recommended for you. Consider joining a support group for men who have prostate cancer. Meeting with a support group may help you learn to manage the stress of having cancer. This information is not intended to replace advice given to you by your health care provider. Make sure you discuss any questions you have with  your health care provider. Document Revised: 03/24/2020 Document Reviewed: 03/24/2020 Elsevier Patient Education  2023 [employer]  Robot-Assisted Laparoscopic Radical Prostatectomy  Robot-assisted laparoscopic radical prostatectomy is surgery done to remove the entire prostate and nearby tissue. This includes the seminal vesicles, which are near the bladder and the prostate. This procedure is done to treat prostate cancer that has not spread (metastasized) to other parts of the body. The goal of the surgery is to remove all cancer cells to help keep the cancer from metastasizing. During this procedure, the surgeon makes several incisions in the abdomen instead of one large incision. A long, thin, lighted tube with a tiny camera on the end (laparoscope) is put into one of the incisions. This allows the surgeon to see inside the abdomen. Other surgical tools are put in through the other incisions and used to take out the prostate and nearby tissues. The surgeon uses robotic arms to control these tools while sitting at a computer near the operating table. Lymph nodes in the pelvis may also be removed. Lymph nodes are part of the body's disease-fighting system (immune system). When prostate cancer spreads, it tends to go to the lymph nodes in the pelvis first. If the pelvic lymph nodes are removed, they will be checked for cancer cells. Tell a health care provider about: Any allergies you have. All medicines you are taking, including vitamins, herbs, eye drops, creams, and over-the-counter medicines. Any problems you or family members have had with anesthetic medicines. Any bleeding problems you have. Any surgeries you have had. Any medical conditions you have. Any prostate infections you have had. What are the risks? Generally, this is a safe procedure. Still, problems may occur, including: Infection. Bleeding. Allergic reactions to medicines. Damage to nearby structures or organs, such as the  rectum, ureters, urethra, bladder, or small intestine. Blockage (obstruction) of the large or small intestines. Problems that affect urination or sexual function. These may include: Narrowing or scarring of the urethra (stricture), which may block the flow of urine. Inability to control when you urinate (incontinence). Inability to get or keep an erection (erectile dysfunction). Dry ejaculation. This is when no semen comes out during orgasm. The formation of a sac (cyst) in the pelvis that is filled with fluid from the lymph glands (lymphocele). Blood clots in the legs. What happens before the procedure? Staying hydrated Follow instructions from your health care provider about hydration, which may include: Up to 2 hours before the procedure - you may continue to drink clear liquids, such as water, clear fruit juice, black coffee, and plain tea.  Eating and drinking restrictions Follow instructions from your health care provider about eating and drinking, which may include: 8 hours before the procedure - stop eating heavy meals or foods, such as meat, fried foods, or fatty foods. 6 hours before the procedure - stop eating light meals or foods, such as toast or cereal. 6 hours before the procedure - stop drinking milk or drinks that contain milk. 2 hours before the procedure - stop drinking clear liquids. Medicines Ask your health care provider about: Changing or stopping your regular medicines. This is especially important   if you are taking diabetes medicines or blood thinners. Taking medicines such as aspirin and ibuprofen. These medicines can thin your blood. Do not take these medicines unless your health care provider tells you to take them. Taking over-the-counter medicines, vitamins, herbs, and supplements. Follow your health care provider's instructions about cleaning out your bowels. Surgery safety Ask your health care provider: How your surgery site will be marked. What steps will  be taken to help prevent infection. These steps may include: Removing hair at the surgery site. Washing skin with a germ-killing soap. Taking antibiotic medicine. General instructions Do not use any products that contain nicotine or tobacco for at least 4 weeks before the procedure. These products include cigarettes, chewing tobacco, and vaping devices, such as e-cigarettes. If you need help quitting, ask your health care provider. Plan to have a responsible adult take you home from the hospital or clinic. Plan to have a responsible adult care for you for the time you are told after you leave the hospital or clinic. You may have an exam or testing. This may include blood or urine samples, or imaging tests such as a CT scan or an MRI. What happens during the procedure? An IV will be put into a vein in your hand or arm. You may be given: A medicine to help you relax (sedative). A medicine to make you fall asleep (general anesthetic). A thin, flexible tube (Foley catheter) will be put into your penis through your urethra and into your bladder to drain your urine. Small incisions will be made in your abdomen and near your belly button. The laparoscope and other surgical instruments will be put through the incisions. The surgical tools will be used to cut and remove your prostate, seminal vesicles, and maybe your pelvic lymph nodes. Your surgeon will use a computer and robotic arms to control the surgical instruments. Your urethra will be cut and separated from your bladder to take out the prostate. Your urethra will then be reconnected to your bladder neck. This is the group of muscles that help push urine through your urethra. A small tube (drain) may be put in one or more of your incisions to help drain extra fluid from your surgical site after surgery. The laparoscope and other surgical instruments will be removed. Your incisions will be closed with stitches (sutures), skin glue, or adhesive  strips. Medicine may be applied and bandages (dressings) will be placed over your incisions. The procedure may vary among health care providers and hospitals. What happens after the procedure? Your blood pressure, heart rate, breathing rate, and blood oxygen level will be monitored until you leave the hospital or clinic. You may get fluids and medicines through your IV. You may be given antibiotics and medicines to help relieve pain or nausea. You will be encouraged to walk as soon as possible. You will also use a device or do breathing exercises to keep your lungs clear. The catheter will stay in to drain urine from your bladder. You will be taught how to care for it at home. The drain may stay in to drain fluid from the surgical site. If so, you will be taught how to care for it at home. You may need to wear compression stockings until you are able to get up and walk around. These stockings help prevent blood clots and reduce swelling in your legs. If you were given a sedative during the procedure, it can affect you for several hours. Do not drive or operate   machinery until your health care provider says that it is safe. Summary Robot-assisted laparoscopic radical prostatectomy is a surgical procedure to remove the entire prostate and the seminal vesicles. Follow instructions from your health care provider about eating and drinking before your surgery. After your procedure, you may be given fluids and medicines through an IV. You may get antibiotics and medicines to help relieve pain or nausea. After your surgery, you will continue to have a small, thin tube (Foley catheter) draining your urine. You will be taught how to care for it at home. This information is not intended to replace advice given to you by your health care provider. Make sure you discuss any questions you have with your health care provider. Document Revised: 03/24/2020 Document Reviewed: 03/24/2020 Elsevier Patient Education   [place] Laparoscopic Radical Prostatectomy, Care After The following information offers guidance on how to care for yourself after your procedure. Your health care provider may also give you more specific instructions. If you have problems or questions, contact your health care provider. What can I expect after the procedure? After the procedure, it is common to have: Mild pain in your abdomen. Blood and small clots in your urine for up to 3 weeks. Bladder spasms, or a need to urinate more often than usual. These may last for up to 2 weeks. Swelling in your penis and in the sac that contains your testicles (scrotum). After your thin, flexible urinary tube (Foley catheter)is removed, it is common to have: Burning when you pass urine. This may last for up to 2 weeks after your catheter is removed. Trouble controlling when you urinate (incontinence). You may leak urine sometimes. Your ability to control your urine should improve as you heal. Follow these instructions at home: Medicines Take over-the-counter and prescription medicines only as told by your health care provider. This includes any stool softeners. If you were prescribed an antibiotic, take it as told by your health care provider. Do not stop taking the antibiotic early, even if you start to feel better. Ask your health care provider if the medicine prescribed to you: Requires you to avoid driving or using machinery. Can cause constipation. You may need to take these actions to prevent or treat constipation: Take over-the-counter or prescription medicines. Eat foods that are high in fiber, such as beans, whole grains, and fresh fruits and vegetables. Limit foods that are high in fat and processed sugars, such as fried or sweet foods. Eating and drinking  Follow instructions from your health care provider about eating or drinking restrictions. Drink enough fluid to keep your urine pale yellow. Incision  care and drain care  Follow instructions from your health care provider about how to take care of your incisions and drains, if present. Make sure you: Wash your hands with soap and water for at least 20 seconds before and after you change your bandages (dressings) or handle drains and drainage tubes. If soap and water are not available, use hand sanitizer. Change your dressings as told by your health care provider. Leave stitches (sutures), skin glue, or adhesive strips in place. These skin closures may need to stay in place for 2 weeks or longer. If adhesive strip edges start to loosen and curl up, you may trim the loose edges. Do not remove adhesive strips unless your health care provider tells you to do that. If you have a drain in any incision, follow instructions about how to care for your drain. Do not remove  the drain tube or any dressing around it unless your health care provider approves. Check your incision site and the skin around your drain every day for signs of infection. Check for: More redness, swelling, or pain. New fluid or blood. Some dried fluid or blood may be present. Warmth. Pus or a bad smell. Catheter care Always wash your hands with soap and water for at least 20 seconds before and after touching your penis, catheter, and drainage bag. If soap and water are not available, use hand sanitizer. Empty your collection bag every 3 hours during the day, or as often as told by your health care provider. Make sure to keep the catheter secured to keep from tugging on it or pulling it out. Make sure there are no kinks in the tubing that could block the flow of urine. Keep the urine collection bag below the level of your bladder. Clean the tip of your penis with soap and water twice a day, or as often as told by your health care provider. If you were given ointment to apply to the tip of your penis, follow instructions about how to do this. You will need to visit your health care  provider to have your catheter removed. It may stay in for up to 3 weeks after your procedure. Managing pain and swelling If directed, put ice on the affected area. To do this: Put ice in a plastic bag. Place a towel between your skin and the bag. Leave the ice on for 20 minutes, 2-3 times a day. Remove the ice if your skin turns bright red. If you cannot feel pain, heat, or cold, you have a greater risk of damage to the area. Activity Do not lift anything that is heavier than 10 lb (4.5 kg), or the limit that you are told, until your health care provider says that it is safe. Avoid intense physical activity for as long as told by your health care provider. Avoid sitting for a long time without moving. Get up to take short walks every 1-2 hours. This improves your blood flow and breathing. Ask for help if you feel weak or unsteady. Return to your normal activities as told by your health care provider. Ask your health care provider what activities are safe for you. You may be able to exercise more as you feel better. Ask your health care provider when it is safe for you to drive. Follow instructions from your health care provider about when it is safe for you to engage in sexual activity. General instructions Do not take baths, swim, or use a hot tub until your health care provider approves. Ask your health care provider if you may take showers. You may only be allowed to take sponge baths. Do not strain to have a bowel movement. Do not use any products that contain nicotine or tobacco. These products include cigarettes, chewing tobacco, and vaping devices, such as e-cigarettes. If you need help quitting, ask your health care provider. If your pelvic lymph nodes were removed for testing, it may take a few weeks to get the results. Ask your health care provider, or the department that is doing the test, when your results will be ready and how you will get them. Keep all follow-up visits. Contact a  health care provider if: You have a fever. An incision opens up. You have any of the following around your incision site or drain area: More redness, swelling, or pain. New fluid or blood. Warmth. Pus or  a bad smell. You have any of these signs of a urinary tract infection: Pain or burning when you urinate. Needing to urinate often without being able to pass much urine. Pain in your back or lower abdomen. You have problems with your catheter or drain, if present. You have bladder spasms more than 3 or 4 times a day. You become constipated. You have pain that gets worse. Get help right away if: Your catheter falls out. You cannot urinate or urine stops coming out through the catheter. You have more bright red blood in your urine 3 weeks or more after your procedure. You have large clots in your urine. You develop: Chest pain or shortness of breath. Swelling, warmth, or pain in your legs. Severe pain or swelling in your abdomen. These symptoms may represent a serious problem that is an emergency. Do not wait to see if the symptoms will go away. Get medical help right away. Call your local emergency services (911 in the [place] Do not drive yourself to the hospital. Summary After your procedure, it is common to have mild pain, blood and small clots in your urine for up to 3 weeks, and a need to urinate more often. Follow instructions from your health care provider about how to take care of your incisions, catheter, and drain, if present. If your pelvic lymph nodes were removed for testing, it may take a few weeks to get the results. Ask your health care provider, or the department that is doing the test, when your results will be ready and how you will get them. Keep all follow-up visits. This information is not intended to replace advice given to you by your health care provider. Make sure you discuss any questions you have with your health care provider. Document Revised: 03/24/2020  Document Reviewed: 03/24/2020 Elsevier Patient Education  [place]

## 2021-11-09 NOTE — Progress Notes (Signed)
   11/09/2021 11:45 AM   Tom Edwards 1960-03-19 [id number]  Reason for visit: Follow up prostate biopsy results  HPI: Healthy 61 year old male who underwent a prostate biopsy on 11/02/2021 for an elevated PSA of 4.9.  This showed a 26 g prostate with a PSA density of 0.19, and 8/12 cores were positive for prostate adenocarcinoma, primarily Gleason score 3+4=7 favorable intermediate risk disease, max core involvement 100%.  He denies any ED or urinary symptoms at baseline.  No prior imaging to review.  We had a lengthy conversation today about the patient's new diagnosis of prostate cancer.  We reviewed the risk classifications per the AUA guidelines including very low risk, low risk, intermediate risk, and high risk disease, and the need for additional staging imaging with CT and bone scan in patients with unfavorable intermediate risk and high risk disease.  I explained that his life expectancy, clinical stage, Gleason score, PSA, and other co-morbidities influence treatment strategies.  We discussed the roles of active surveillance, radiation therapy, surgical therapy with robotic prostatectomy, and hormone therapy with androgen deprivation.  We discussed that patients urinary symptoms also impact treatment strategy, as patients with severe lower urinary tract symptoms may have significant worsening or even develop urinary retention after undergoing radiation.  In regards to surgery, we discussed robotic prostatectomy +/- lymphadenectomy at length.  The procedure takes 3 to 4 hours, and patient's typically discharge home on post-op day #1.  A Foley catheter is left in place for 7 to 10 days to allow for healing of the vesicourethral anastomosis.  There is a small risk of bleeding, infection, damage to surrounding structures or bowel, hernia, DVT/PE, or serious cardiac or pulmonary complications.  We discussed at length post-op side effects including erectile dysfunction, and the importance of  pre-operative erectile function on long-term outcomes.  Even with a nerve sparing approach, there is an approximately 25% rate of permanent erectile dysfunction.  We also discussed postop urinary incontinence at length.  We expect patients to have stress incontinence post-operatively that will improve over period of weeks to months.  Less than 10% of men will require a pad at 1 year after surgery.  Patients will need to avoid heavy lifting and strenuous activity for 3 to 4 weeks, but most men return to their baseline activity status by 6 weeks.  In summary, Tom Edwards is a 61 y.o. man with newly diagnosed favorable intermediate risk prostate cancer.  We opted to pursue CT abdomen and pelvis to rule out metastatic disease with his high-volume disease.  He is undecided on how he would like to proceed.  We will set him up with radiation oncology to discuss external beam radiation or brachytherapy as well, and see him back in 2 to 3 weeks to finalize treatment strategy.  I spent 45 total minutes on the day of the encounter including pre-visit review of the medical record, face-to-face time with the patient, and post visit ordering of labs/imaging/tests.   Billey Co, [facility] [address] [place], [place] [address] [phone]

## 2021-11-16 ENCOUNTER — Ambulatory Visit
Admission: RE | Admit: 2021-11-16 | Discharge: 2021-11-16 | Disposition: A | Discharge status: Home / Self Care | Payer: 59 | Source: Ambulatory Visit | Attending: Radiation Oncology | Admitting: Radiation Oncology

## 2021-11-16 ENCOUNTER — Encounter: Payer: Self-pay | Admitting: Physician Assistant

## 2021-11-16 ENCOUNTER — Encounter: Payer: Self-pay | Admitting: Radiation Oncology

## 2021-11-16 ENCOUNTER — Ambulatory Visit: Payer: Self-pay | Admitting: Physician Assistant

## 2021-11-16 VITALS — BP 142/99 | HR 70 | Temp 97.2°F | Resp 14 | Ht 68.0 in | Wt 189.1 lb

## 2021-11-16 DIAGNOSIS — C61 Malignant neoplasm of prostate: Secondary | ICD-10-CM

## 2021-11-16 DIAGNOSIS — Z79899 Other long term (current) drug therapy: Secondary | ICD-10-CM | POA: Insufficient documentation

## 2021-11-16 DIAGNOSIS — F17210 Nicotine dependence, cigarettes, uncomplicated: Secondary | ICD-10-CM | POA: Diagnosis not present

## 2021-11-16 DIAGNOSIS — Z191 Hormone sensitive malignancy status: Secondary | ICD-10-CM | POA: Diagnosis not present

## 2021-11-16 NOTE — Consult Note (Signed)
NEW PATIENT EVALUATION  Name: Tom Edwards  MRN: [id number]  Date:   11/16/2021     DOB: 1960/08/16   This 61 y.o. male patient presents to the stage IIb (T1 cN0 M0) Gleason 7 (3+4) adenocarcinoma the prostate presenting with a PSA in the 5 range REFERRING PHYSICIAN: Sable Feil, PA-C  CHIEF COMPLAINT: No chief complaint on file.   DIAGNOSIS: There were no encounter diagnoses.   PREVIOUS INVESTIGATIONS:  CT scan abdomen pelvis has been ordered Pathology reports reviewed Clinical notes reviewed  HPI: Patient is a 61 year old male who presents with an elevated PSA in the 5 range.  He was found to have a 26 g prostate.  He underwent transrectal ultrasound-guided biopsy positive for 6 of 12 cores with primarily Gleason 7 (3+4).  He was asymptomatic specifically denies any increased lower urinary tract symptoms ED.  CT scan of abdomen pelvis has been ordered and is pending.  He has been seen by urology and robotic prostatectomy has been discussed he is now referred to radiation oncology for consideration of treatment.  PLANNED TREATMENT REGIMEN: I-125 interstitial implant  PAST MEDICAL HISTORY:  has a past medical history of Acute medial meniscus tear, DM (diabetes mellitus), type 2 ([place]), Gout, Hyperlipemia, Hypertension, Over weight, Sedentary lifestyle, Sleep apnea, Vertigo, and Vertigo.    PAST SURGICAL HISTORY:  Past Surgical History:  Procedure Laterality Date   COLONOSCOPY     COLONOSCOPY WITH PROPOFOL N/A 02/11/2021   Procedure: COLONOSCOPY WITH PROPOFOL;  Surgeon: Lesly Rubenstein, MD;  Location: [facility] ENDOSCOPY;  Service: Endoscopy;  Laterality: N/A;   INGUINAL HERNIA REPAIR Left 07/24/2018   Procedure: HERNIA REPAIR INGUINAL ADULT, LEFT OPEN;  Surgeon: Robert Bellow, MD;  Location: [facility] ORS;  Service: General;  Laterality: Left;    FAMILY HISTORY: family history includes Cerebrovascular Accident in his mother; Diabetes in his brother and mother; Hypertension in his  father; Prostate cancer in his father.  SOCIAL HISTORY:  reports that he has been smoking cigars. He has been exposed to tobacco smoke. He has never used smokeless tobacco. He reports current alcohol use of about 10.0 standard drinks of alcohol per week. He reports that he does not use drugs.  ALLERGIES: Patient has no known allergies.  MEDICATIONS:  Current Outpatient Medications  Medication Sig Dispense Refill   allopurinol (ZYLOPRIM) 300 MG tablet Take 1 tablet (300 mg total) by mouth daily. 90 tablet 2   amLODipine (NORVASC) 10 MG tablet Take 1 tablet (10 mg total) by mouth daily. 90 tablet 3   atorvastatin (LIPITOR) 10 MG tablet Take 1 tablet (10 mg total) by mouth daily. 90 tablet 2   bisoprolol (ZEBETA) 10 MG tablet Take 1 tablet (10 mg total) by mouth daily. 90 tablet 3   brompheniramine-pseudoephedrine-DM 30-2-10 MG/5ML syrup Take 5 mLs by mouth 4 (four) times daily as needed. 120 mL 0   metFORMIN (GLUCOPHAGE) 500 MG tablet Take 1 tablet (500 mg total) by mouth 2 (two) times daily with a meal. 180 tablet 3   No current facility-administered medications for this encounter.    ECOG PERFORMANCE STATUS:  0 - Asymptomatic  REVIEW OF SYSTEMS: Patient denies any weight loss, fatigue, weakness, fever, chills or night sweats. Patient denies any loss of vision, blurred vision. Patient denies any ringing  of the ears or hearing loss. No irregular heartbeat. Patient denies heart murmur or history of fainting. Patient denies any chest pain or pain radiating to her upper extremities. Patient denies any shortness of  breath, difficulty breathing at night, cough or hemoptysis. Patient denies any swelling in the lower legs. Patient denies any nausea vomiting, vomiting of blood, or coffee ground material in the vomitus. Patient denies any stomach pain. Patient states has had normal bowel movements no significant constipation or diarrhea. Patient denies any dysuria, hematuria or significant nocturia.  Patient denies any problems walking, swelling in the joints or loss of balance. Patient denies any skin changes, loss of hair or loss of weight. Patient denies any excessive worrying or anxiety or significant depression. Patient denies any problems with insomnia. Patient denies excessive thirst, polyuria, polydipsia. Patient denies any swollen glands, patient denies easy bruising or easy bleeding. Patient denies any recent infections, allergies or URI. Patient "s visual fields have not changed significantly in recent time.   PHYSICAL EXAM: There were no vitals taken for this visit. Well-developed well-nourished patient in NAD. HEENT reveals PERLA, EOMI, discs not visualized.  Oral cavity is clear. No oral mucosal lesions are identified. Neck is clear without evidence of cervical or supraclavicular adenopathy. Lungs are clear to A&P. Cardiac examination is essentially unremarkable with regular rate and rhythm without murmur rub or thrill. Abdomen is benign with no organomegaly or masses noted. Motor sensory and DTR levels are equal and symmetric in the upper and lower extremities. Cranial nerves II through XII are grossly intact. Proprioception is intact. No peripheral adenopathy or edema is identified. No motor or sensory levels are noted. Crude visual fields are within normal range.  LABORATORY DATA: Pathology reports reviewed    RADIOLOGY RESULTS: None of abdomen pelvis will be reviewed when available   IMPRESSION: Stage IIb Gleason 7 (3+4) adenocarcinoma the prostate in 61 year old male  PLAN: This time we discussed treatment options including external beam treatment I-125 interstitial implant or robotic prostatectomy.  Patient's decision comes down to between implant and prostatectomy.  He is young and I have explained to him I would favor prostatectomy since we always have the option of salvage should he have biochemical failure after that procedure.  I have also explained the alternative that  there is really no salvage surgery after interstitial implant.   Risks and benefits of treatment occluding ncreased lower urinary tract symptoms possible diarrhea risks of general anesthesia and radiation safety precautions all were reviewed in detail with the patient.  Patient will make a decision although he may be leaning towards robotic prostatectomy and either talk to Dr. Tilman Neat office for scheduling that procedure versus calling us back for volume study.  I would like to take this opportunity to thank you for allowing me to participate in the care of your patient.Noreene Filbert, MD

## 2021-11-16 NOTE — Progress Notes (Signed)
Pt has personal medical questions he would like to ask Ron smith,PA-C

## 2021-11-16 NOTE — Progress Notes (Signed)
   Subjective: Prostate cancer    Patient ID: Tom Edwards, male    DOB: 1961-01-01, 61 y.o.   MRN: [id number]  HPI Patient called to discuss findings of prostate cancer status post visit to urologist.  Patient was counseled by urologist and given different treatment options.  Patient was advised to have a CT of the abdomen and pelvis before proceeding with treatment options.   Review of Systems Negative except for chief complaint    Objective:   Physical Exam  Deferred      Assessment & Plan: Prostate cancer   Discussed upcoming CT scan of the abdomen and pelvic to rule out the metastasis.  Discussed treatment options that were mentioned by urologist.  Patient states he is leaning heavily to was having prostatectomy.  We will follow-up as needed.

## 2021-11-18 ENCOUNTER — Ambulatory Visit: Admission: RE | Admit: 2021-11-18 | Payer: 59 | Source: Ambulatory Visit

## 2021-11-18 DIAGNOSIS — C61 Malignant neoplasm of prostate: Secondary | ICD-10-CM | POA: Diagnosis not present

## 2021-12-08 ENCOUNTER — Ambulatory Visit: Payer: 59 | Admitting: Urology

## 2022-02-09 ENCOUNTER — Other Ambulatory Visit: Payer: Self-pay

## 2022-02-09 DIAGNOSIS — M1A9XX0 Chronic gout, unspecified, without tophus (tophi): Secondary | ICD-10-CM

## 2022-02-09 DIAGNOSIS — C61 Malignant neoplasm of prostate: Secondary | ICD-10-CM | POA: Diagnosis not present

## 2022-02-09 MED ORDER — ALLOPURINOL 300 MG PO TABS: 300.0000 mg | ORAL_TABLET | Freq: Every day | ORAL | 2 refills | Status: DC

## 2022-03-13 DIAGNOSIS — N4232 Atypical small acinar proliferation of prostate: Secondary | ICD-10-CM | POA: Diagnosis not present

## 2022-03-13 DIAGNOSIS — C61 Malignant neoplasm of prostate: Secondary | ICD-10-CM | POA: Diagnosis not present

## 2022-03-13 DIAGNOSIS — N4231 Prostatic intraepithelial neoplasia: Secondary | ICD-10-CM | POA: Diagnosis not present

## 2022-04-18 DIAGNOSIS — C61 Malignant neoplasm of prostate: Secondary | ICD-10-CM | POA: Diagnosis not present

## 2022-05-03 ENCOUNTER — Ambulatory Visit: Payer: Self-pay

## 2022-05-03 DIAGNOSIS — Z0000 Encounter for general adult medical examination without abnormal findings: Secondary | ICD-10-CM

## 2022-05-03 LAB — POCT URINALYSIS DIPSTICK
Bilirubin, UA: NEGATIVE
Blood, UA: NEGATIVE
Glucose, UA: NEGATIVE
Ketones, UA: NEGATIVE
Leukocytes, UA: NEGATIVE
Nitrite, UA: NEGATIVE
Protein, UA: NEGATIVE
Spec Grav, UA: 1.01 (ref 1.010–1.025)
Urobilinogen, UA: 0.2 E.U./dL
pH, UA: 6 (ref 5.0–8.0)

## 2022-05-03 NOTE — Progress Notes (Signed)
Fasting labs, urine and EKG completed for COB pre physical.

## 2022-05-04 LAB — CMP12+LP+TP+TSH+6AC+PSA+CBC…
ALT: 23 IU/L (ref 0–44)
AST: 20 IU/L (ref 0–40)
Albumin/Globulin Ratio: 1.9 (ref 1.2–2.2)
Albumin: 4.7 g/dL (ref 3.9–4.9)
Alkaline Phosphatase: 70 IU/L (ref 44–121)
BUN/Creatinine Ratio: 10 (ref 10–24)
BUN: 12 mg/dL (ref 8–27)
Basophils Absolute: 0 10*3/uL (ref 0.0–0.2)
Basos: 0 %
Bilirubin Total: 0.7 mg/dL (ref 0.0–1.2)
Calcium: 9.2 mg/dL (ref 8.6–10.2)
Chloride: 102 mmol/L (ref 96–106)
Chol/HDL Ratio: 4.7 ratio (ref 0.0–5.0)
Cholesterol, Total: 194 mg/dL (ref 100–199)
Creatinine, Ser: 1.25 mg/dL (ref 0.76–1.27)
EOS (ABSOLUTE): 0.1 10*3/uL (ref 0.0–0.4)
Eos: 2 %
Estimated CHD Risk: 1 times avg. (ref 0.0–1.0)
Free Thyroxine Index: 1.5 (ref 1.2–4.9)
GGT: 23 IU/L (ref 0–65)
Globulin, Total: 2.5 g/dL (ref 1.5–4.5)
Glucose: 159 mg/dL — ABNORMAL HIGH (ref 70–99)
HDL: 41 mg/dL (ref >39)
Hematocrit: 42.1 % (ref 37.5–51.0)
Hemoglobin: 14.2 g/dL (ref 13.0–17.7)
Immature Grans (Abs): 0 10*3/uL (ref 0.0–0.1)
Immature Granulocytes: 0 %
Iron: 87 ug/dL (ref 38–169)
LDH: 190 IU/L (ref 121–224)
LDL Chol Calc (NIH): 132 mg/dL — ABNORMAL HIGH (ref 0–99)
Lymphocytes Absolute: 2.3 10*3/uL (ref 0.7–3.1)
Lymphs: 48 %
MCH: 31.9 pg (ref 26.6–33.0)
MCHC: 33.7 g/dL (ref 31.5–35.7)
MCV: 95 fL (ref 79–97)
Monocytes Absolute: 0.4 10*3/uL (ref 0.1–0.9)
Monocytes: 7 %
Neutrophils Absolute: 2 10*3/uL (ref 1.4–7.0)
Neutrophils: 43 %
Phosphorus: 4.5 mg/dL — ABNORMAL HIGH (ref 2.8–4.1)
Platelets: 218 10*3/uL (ref 150–450)
Potassium: 4.8 mmol/L (ref 3.5–5.2)
Prostate Specific Ag, Serum: 6.3 ng/mL — ABNORMAL HIGH (ref 0.0–4.0)
RBC: 4.45 x10E6/uL (ref 4.14–5.80)
RDW: 13.6 % (ref 11.6–15.4)
Sodium: 140 mmol/L (ref 134–144)
T3 Uptake Ratio: 28 % (ref 24–39)
T4, Total: 5.2 ug/dL (ref 4.5–12.0)
TSH: 1.55 u[IU]/mL (ref 0.450–4.500)
Total Protein: 7.2 g/dL (ref 6.0–8.5)
Triglycerides: 113 mg/dL (ref 0–149)
Uric Acid: 8.7 mg/dL — ABNORMAL HIGH (ref 3.8–8.4)
VLDL Cholesterol Cal: 21 mg/dL (ref 5–40)
WBC: 4.7 10*3/uL (ref 3.4–10.8)
eGFR: 65 mL/min/{1.73_m2} (ref >59)

## 2022-05-04 LAB — MICROALBUMIN / CREATININE URINE RATIO
Creatinine, Urine: 41.2 mg/dL
Microalb/Creat Ratio: <7 mg/g creat (ref 0–29)
Microalbumin, Urine: <3 ug/mL

## 2022-05-04 LAB — HGB A1C W/O EAG: Hgb A1c MFr Bld: 7.1 % — ABNORMAL HIGH (ref 4.8–5.6)

## 2022-05-08 DIAGNOSIS — C61 Malignant neoplasm of prostate: Secondary | ICD-10-CM | POA: Diagnosis not present

## 2022-05-10 ENCOUNTER — Ambulatory Visit: Payer: Self-pay | Admitting: Physician Assistant

## 2022-05-10 ENCOUNTER — Encounter: Payer: Self-pay | Admitting: Physician Assistant

## 2022-05-10 VITALS — BP 124/82 | HR 96 | Temp 98.4°F | Ht 68.0 in | Wt 197.0 lb

## 2022-05-10 DIAGNOSIS — C61 Malignant neoplasm of prostate: Secondary | ICD-10-CM

## 2022-05-10 DIAGNOSIS — Z0000 Encounter for general adult medical examination without abnormal findings: Secondary | ICD-10-CM

## 2022-05-10 DIAGNOSIS — E119 Type 2 diabetes mellitus without complications: Secondary | ICD-10-CM

## 2022-05-10 NOTE — Progress Notes (Signed)
Pt presents today to complete physical, no concerns or issues.

## 2022-05-10 NOTE — Progress Notes (Signed)
[facility]  ____________________________________________   None    (approximate)  I have reviewed the triage vital signs and the nursing notes.   HISTORY  Chief Complaint Annual Exam  HPI Tom Edwards is a 62 y.o. male patient presents for annual physical exam.  Since last physical exam patient has been diagnosed with prostate cancer.  Currently followed by urology for definitive evaluation and treatment.  Patient also admits to noncompliance of metformin.  Patient states takes morning dose but frequently forgets to take evening dose.  Patient advised to set alarm on his smart phone  to remind him to take medication.         Past Medical History:  Diagnosis Date   Acute medial meniscus tear    DM (diabetes mellitus), type 2 ([facility])    Gout    Hyperlipemia    Hypertension    Over weight    Sedentary lifestyle    Sleep apnea    no cpap   Vertigo    Vertigo     Patient Active Problem List   Diagnosis Date Noted   Cancer of prostate with intermediate recurrence risk, stage T2B-C or Gleason 7 or prostate-specific antigen (PSA) 10-20 ([facility]) 11/02/2021   S/P inguinal hernia repair 08/07/2018   Inguinal hernia 07/16/2018    Past Surgical History:  Procedure Laterality Date   COLONOSCOPY     COLONOSCOPY WITH PROPOFOL N/A 02/11/2021   Procedure: COLONOSCOPY WITH PROPOFOL;  Surgeon: Regis Bill, MD;  Location: [facility] END[facility];  Service: Endoscopy;  Laterality: N/A;   INGUINAL HERNIA REPAIR Left 07/24/2018   Procedure: HERNIA REPAIR INGUINAL ADULT, LEFT OPEN;  Surgeon: Earline Mayotte, MD;  Location: [facility] ORS;  Service: General;  Laterality: Left;    Prior to Admission medications   Medication Sig Start Date End Date Taking? Authorizing Provider  allopurinol (ZYLOPRIM) 300 MG tablet Take 1 tablet (300 mg total) by mouth daily. 02/09/22   Joni Reining, PA-C  amLODipine (NORVASC) 10 MG tablet Take 1 tablet (10 mg total) by mouth  daily. 10/07/21   Joni Reining, PA-C  atorvastatin (LIPITOR) 10 MG tablet Take 1 tablet (10 mg total) by mouth daily. Patient not taking: Reported on 11/16/2021 11/22/19   Joni Reining, PA-C  bisoprolol (ZEBETA) 10 MG tablet Take 1 tablet (10 mg total) by mouth daily. 08/17/21   Joni Reining, PA-C  brompheniramine-pseudoephedrine-DM 30-2-10 MG/5ML syrup Take 5 mLs by mouth 4 (four) times daily as needed. Patient not taking: Reported on 11/16/2021 03/09/21   Joni Reining, PA-C  metFORMIN (GLUCOPHAGE) 500 MG tablet Take 1 tablet (500 mg total) by mouth 2 (two) times daily with a meal. 10/25/21   Joni Reining, PA-C    Allergies Patient has no known allergies.  Family History  Problem Relation Age of Onset   Diabetes Mother    Cerebrovascular Accident Mother    Prostate cancer Father    Hypertension Father    Diabetes Brother     Social History Social History   Tobacco Use   Smoking status: Some Days    Types: Cigars    Passive exposure: Current   Smokeless tobacco: Never  Vaping Use   Vaping Use: Never used  Substance Use Topics   Alcohol use: Yes    Alcohol/week: 10.0 standard drinks of alcohol    Types: 7 Cans of beer, 3 Shots of liquor per week   Drug use: Never    Review  of Systems Constitutional: No fever/chills Eyes: No visual changes. ENT: No sore throat. Cardiovascular: Denies chest pain. Respiratory: Denies shortness of breath. Gastrointestinal: No abdominal pain.  No nausea, no vomiting.  No diarrhea.  No constipation. Genitourinary: Negative for dysuria.  Prostate cancer Musculoskeletal: Negative for back pain. Skin: Negative for rash. Neurological: Negative for headaches, focal weakness or numbness. Endocrine: Diabetes, gout, hyperlipidemia, and hypertension ____________________________________________   PHYSICAL EXAM:  VITAL SIGNS: BP 124/82  Pulse 96  Temp 98.4 F (36.9 C)  Temp src Temporal  SpO2 95 %  Weight 197 lb (89.4 kg)  Height  5\' 8"  (1.727 m)   BMI 29.95 kg/m2  BSA 2.07 m2  Constitutional: Alert and oriented. Well appearing and in no acute distress. Eyes: Conjunctivae are normal. PERRL. EOMI. Head: Atraumatic. Nose: No congestion/rhinnorhea. Mouth/Throat: Mucous membranes are moist.  Oropharynx non-erythematous. Neck: No stridor.  No cervical spine tenderness to palpation. Hematological/Lymphatic/Immunilogical: No cervical lymphadenopathy. Cardiovascular: Normal rate, regular rhythm. Grossly normal heart sounds.  Good peripheral circulation. Respiratory: Normal respiratory effort.  No retractions. Lungs CTAB. Gastrointestinal: Soft and nontender. No distention. No abdominal bruits. No CVA tenderness. Genitourinary: Deferred Musculoskeletal: No lower extremity tenderness nor edema.  No joint effusions. Neurologic:  Normal speech and language. No gross focal neurologic deficits are appreciated. No gait instability. Skin:  Skin is warm, dry and intact. No rash noted. Psychiatric: Mood and affect are normal. Speech and behavior are normal.  ____________________________________________   LABS  2 d ago   Sodium 135 - 145 mmol/L 136  Potassium 3.5 - 5.0 mmol/L 4.6  Chloride 98 - 108 mmol/L 100  Carbon Dioxide (CO2) 21 - 30 mmol/L 26  Urea Nitrogen (BUN) 7 - 20 mg/dL 15  Creatinine 0.6 - 1.3 mg/dL 1.2  Glucose 70 - 161 mg/dL 096 High   Comment: Interpretive Data: Above is the NONFASTING reference range.  Below are the FASTING reference ranges: NORMAL:      70-99 mg/dL PREDIABETES: 045-409 mg/dL DIABETES:    > 811 mg/dL  Calcium 8.7 - 91.4 mg/dL 9.6  AST (Aspartate Aminotransferase) 15 - 41 U/L 26  ALT (Alanine Aminotransferase) 15 - 50 U/L 25  Bilirubin, Total 0.4 - 1.5 mg/dL 0.9  Alk Phos (Alkaline Phosphatase) 24 - 110 U/L 53  Albumin 3.5 - 4.8 g/dL 4.8  Protein, Total 6.2 - 8.1 g/dL 8.2 High   Anion Gap 3 - 12 mmol/L 10  BUN/CREA Ratio 6 - 27 13  Glomerular Filtration Rate  (eGFR) mL/min/1.73sq m 68  Comment: CKD-EPI (2021) does not include patient's race in the calculation of eGFR. Monitoring changes of plasma creatinine and eGFR over time is useful for monitoring kidney function.  This change was made on 03/09/2020.  Interpretive Ranges for eGFR(CKD-EPI 2021):  eGFR:              > 60 mL/min/1.73 sq m - Normal eGFR:              30 - 59 mL/min/1.73 sq m - Moderately Decreased eGFR:              15 - 29 mL/min/1.73 sq m - Severely Decreased eGFR:              < 15 mL/min/1.73 sq m -  Kidney Failure   Note: These eGFR calculations do not apply in acute situations  _  Ref Range & Units 2 d ago  Color Colorless, Straw, Light Yellow, Yellow, Dark Yellow Colorless  Clarity Clear Clear  Specific Gravity 1.005 - 1.030 1.007  pH, Urine 5.0 - 8.0 6.0  Protein, Urinalysis Negative Negative  Glucose, Urinalysis Negative Negative  Ketones, Urinalysis Negative Negative  Blood, Urinalysis Negative Negative  Nitrite, Urinalysis Negative Negative  Leukocytes, Urinalysis Negative Negative  Bilirubin, Urinalysis Negative Negative  Urobilinogen, Urinalysis 0.2 - 1.0 mg/dL 0.2  Red Blood Cells, Urinalysis <=3 /hpf <1  WBC, UA <=5 /hpf <1  Squamous Epithelial Cells, Urinalysis /hpf 0  Hyaline Casts /lpf 0            Component Ref Range & Units 7 d ago (05/03/22) 11 mo ago (06/07/21) 1 yr ago (02/21/21) 1 yr ago (07/14/20) 2 yr ago (01/21/20) 2 yr ago (07/24/19) 3 yr ago (04/24/19)  Hgb A1c MFr Bld 4.8 - 5.6 % 7.1 High  6.7 High  CM 7.0 High  CM 6.6 High  CM 6.8 High  CM 6.3 High  CM 5.3 R  Comment:          Prediabetes: 5.7 - 6.4          Diabetes: >6.4                  ___________________________________________  EKG  Normal sinus rhythm at 72 bpm ____________________________________________    ____________________________________________   INITIAL IMPRESSION / ASSESSMENT AND PLAN  As part of my medical decision making, I reviewed the  following data within the electronic MEDICAL RECORD NUMBER      No acute findings on physical exam.  Patient advised to follow-up in 3 months with hemoglobin A1c.  Patient sat his smart phone to remind him to take medications.       ____________________________________________   FINAL CLINICAL IMPRESSION(S) / ED DIAGNOSES  Well exam   ED Discharge Orders     None        Note:  This document was prepared using Dragon voice recognition software and may include unintentional dictation errors.

## 2022-05-18 DIAGNOSIS — C61 Malignant neoplasm of prostate: Secondary | ICD-10-CM | POA: Diagnosis not present

## 2022-05-18 DIAGNOSIS — Z01818 Encounter for other preprocedural examination: Secondary | ICD-10-CM | POA: Diagnosis not present

## 2022-05-23 DIAGNOSIS — I10 Essential (primary) hypertension: Secondary | ICD-10-CM | POA: Diagnosis not present

## 2022-05-23 DIAGNOSIS — Z87891 Personal history of nicotine dependence: Secondary | ICD-10-CM | POA: Diagnosis not present

## 2022-05-23 DIAGNOSIS — Z79899 Other long term (current) drug therapy: Secondary | ICD-10-CM | POA: Diagnosis not present

## 2022-05-23 DIAGNOSIS — M109 Gout, unspecified: Secondary | ICD-10-CM | POA: Diagnosis not present

## 2022-05-23 DIAGNOSIS — Z7984 Long term (current) use of oral hypoglycemic drugs: Secondary | ICD-10-CM | POA: Diagnosis not present

## 2022-05-23 DIAGNOSIS — C61 Malignant neoplasm of prostate: Secondary | ICD-10-CM | POA: Diagnosis not present

## 2022-05-23 DIAGNOSIS — E119 Type 2 diabetes mellitus without complications: Secondary | ICD-10-CM | POA: Diagnosis not present

## 2022-05-23 DIAGNOSIS — K660 Peritoneal adhesions (postprocedural) (postinfection): Secondary | ICD-10-CM | POA: Diagnosis not present

## 2022-05-24 DIAGNOSIS — Z7984 Long term (current) use of oral hypoglycemic drugs: Secondary | ICD-10-CM | POA: Diagnosis not present

## 2022-05-24 DIAGNOSIS — E119 Type 2 diabetes mellitus without complications: Secondary | ICD-10-CM | POA: Diagnosis not present

## 2022-05-24 DIAGNOSIS — C61 Malignant neoplasm of prostate: Secondary | ICD-10-CM | POA: Diagnosis not present

## 2022-05-24 DIAGNOSIS — M109 Gout, unspecified: Secondary | ICD-10-CM | POA: Diagnosis not present

## 2022-05-24 DIAGNOSIS — Z79899 Other long term (current) drug therapy: Secondary | ICD-10-CM | POA: Diagnosis not present

## 2022-05-24 DIAGNOSIS — Z87891 Personal history of nicotine dependence: Secondary | ICD-10-CM | POA: Diagnosis not present

## 2022-05-24 DIAGNOSIS — I10 Essential (primary) hypertension: Secondary | ICD-10-CM | POA: Diagnosis not present

## 2022-05-30 DIAGNOSIS — Z466 Encounter for fitting and adjustment of urinary device: Secondary | ICD-10-CM | POA: Diagnosis not present

## 2022-07-03 DIAGNOSIS — C61 Malignant neoplasm of prostate: Secondary | ICD-10-CM | POA: Diagnosis not present

## 2022-08-10 ENCOUNTER — Other Ambulatory Visit: Payer: 59

## 2022-08-28 ENCOUNTER — Other Ambulatory Visit: Payer: Self-pay

## 2022-08-28 DIAGNOSIS — I10 Essential (primary) hypertension: Secondary | ICD-10-CM

## 2022-08-28 MED ORDER — BISOPROLOL FUMARATE 10 MG PO TABS
10.0000 mg | ORAL_TABLET | Freq: Every day | ORAL | 3 refills | Status: DC
Start: 2022-08-28 — End: 2023-10-04

## 2022-10-02 DIAGNOSIS — C61 Malignant neoplasm of prostate: Secondary | ICD-10-CM | POA: Diagnosis not present

## 2022-12-04 ENCOUNTER — Other Ambulatory Visit: Payer: Self-pay

## 2022-12-04 DIAGNOSIS — E119 Type 2 diabetes mellitus without complications: Secondary | ICD-10-CM

## 2022-12-04 MED ORDER — METFORMIN HCL 500 MG PO TABS
500.0000 mg | ORAL_TABLET | Freq: Two times a day (BID) | ORAL | 3 refills | Status: DC
Start: 2022-12-04 — End: 2023-05-17

## 2022-12-05 ENCOUNTER — Ambulatory Visit
Admission: RE | Admit: 2022-12-05 | Discharge: 2022-12-05 | Disposition: A | Discharge status: Home / Self Care | Payer: 59 | Attending: Physician Assistant | Admitting: Physician Assistant

## 2022-12-05 ENCOUNTER — Encounter: Payer: Self-pay | Admitting: Physician Assistant

## 2022-12-05 ENCOUNTER — Ambulatory Visit: Payer: Self-pay | Admitting: Physician Assistant

## 2022-12-05 ENCOUNTER — Ambulatory Visit
Admission: RE | Admit: 2022-12-05 | Discharge: 2022-12-05 | Disposition: A | Discharge status: Home / Self Care | Payer: 59 | Source: Ambulatory Visit | Attending: Physician Assistant | Admitting: Physician Assistant

## 2022-12-05 VITALS — BP 138/86 | HR 73 | Temp 98.0°F | Resp 16 | Ht 68.0 in | Wt 189.0 lb

## 2022-12-05 DIAGNOSIS — M79671 Pain in right foot: Secondary | ICD-10-CM | POA: Insufficient documentation

## 2022-12-05 DIAGNOSIS — M19071 Primary osteoarthritis, right ankle and foot: Secondary | ICD-10-CM | POA: Diagnosis not present

## 2022-12-05 DIAGNOSIS — G8929 Other chronic pain: Secondary | ICD-10-CM | POA: Insufficient documentation

## 2022-12-05 DIAGNOSIS — M2011 Hallux valgus (acquired), right foot: Secondary | ICD-10-CM | POA: Diagnosis not present

## 2022-12-05 NOTE — Progress Notes (Signed)
Stated pain/lump on bottom of right  foot about approximately 5-6 months.  No open skin noted on bottom of foot.  No complaints of left foot.  Stated left is dominant side.

## 2022-12-05 NOTE — Progress Notes (Signed)
   Subjective: Right foot pain    Patient ID: Tom Edwards, male    DOB: October 25, 1960, 62 y.o.   MRN: [id number]  HPI Patient complain of increasing pain to the plantar aspect of the right foot for 6 months.  Patient states the palpable lesions along the metatarsal of the first digit.   Review of Systems Negative except for above complaint    Objective:   Physical Exam No acute distress.  Atypical gait. No obvious deformity to the right foot.  Plantar aspect of foot reveal 3-4 solid palpable lesions.     Assessment & Plan: Right foot pain   Differential consist of bony osteophytes versus calcified cyst.  Will start with imaging of the right foot.  Patient will follow-up in 6 days.

## 2022-12-06 ENCOUNTER — Ambulatory Visit: Payer: 59 | Admitting: Physician Assistant

## 2022-12-11 ENCOUNTER — Ambulatory Visit: Payer: Self-pay | Admitting: Physician Assistant

## 2022-12-11 ENCOUNTER — Encounter: Payer: Self-pay | Admitting: Physician Assistant

## 2022-12-11 DIAGNOSIS — G8929 Other chronic pain: Secondary | ICD-10-CM

## 2022-12-11 NOTE — Addendum Note (Signed)
Addended by: Gardner Candle on: 12/11/2022 09:57 AM   Modules accepted: Orders

## 2022-12-11 NOTE — Progress Notes (Signed)
   Subjective: Right foot pain    Patient ID: Tom Edwards, male    DOB: [birth year], 62 y.o.   MRN: [id number]  HPI Patient follow-up for right foot pain of chronic nature.  Patient was seen last week in his clinic and sent for x-rays.  X-ray showed minimal degenerative spurring of the great toe.  This does not explain the palpable nodular lesions plantar aspect of the right foot.   Review of Systems Diabetes    Objective:   Physical Exam Vital signs deferred.  Foot exam of the plantar aspect of the right foot reveals 3 palpable firm lesion.      Assessment & Plan: Chronic right foot pain   Further evaluation with podiatry is warranted.  Consult will be generated.  Patient will follow-up after podiatry evaluation.

## 2022-12-20 ENCOUNTER — Other Ambulatory Visit: Payer: Self-pay

## 2022-12-20 DIAGNOSIS — I10 Essential (primary) hypertension: Secondary | ICD-10-CM

## 2022-12-20 MED ORDER — AMLODIPINE BESYLATE 10 MG PO TABS
10.0000 mg | ORAL_TABLET | Freq: Every day | ORAL | 3 refills | Status: AC
Start: 2022-12-20 — End: ?

## 2022-12-21 ENCOUNTER — Ambulatory Visit: Payer: 59

## 2022-12-21 ENCOUNTER — Ambulatory Visit (INDEPENDENT_AMBULATORY_CARE_PROVIDER_SITE_OTHER): Payer: 59 | Admitting: Podiatry

## 2022-12-21 VITALS — Ht 68.0 in | Wt 189.0 lb

## 2022-12-21 DIAGNOSIS — M21961 Unspecified acquired deformity of right lower leg: Secondary | ICD-10-CM

## 2022-12-21 DIAGNOSIS — M21962 Unspecified acquired deformity of left lower leg: Secondary | ICD-10-CM | POA: Diagnosis not present

## 2022-12-21 NOTE — Progress Notes (Signed)
Subjective:  Patient ID: Tom Edwards, male    DOB: Jul 30, 1960,  MRN: [id number]  Chief Complaint  Patient presents with   Foot Pain    Pt is here due to chronic foot pain, pt states out of 1-10 foot pain is a 1 & 1/2 states it feels like there are beads in the bottom of his foot, xrays done 11-26    62 y.o. male presents with the above complaint.  Patient presents with bilateral flatfoot deformity.  Patient states painful to touch pain scale is 1 out of 10.  He wanted to get it evaluated.  He states he got a little knot on the bottom right foot consistent with plantar fibroma.  Does not cause him pain just wanted to make sure it is okay he is a diabetic pain scale 7 out of 10 dull aching nature   Review of Systems: Negative except as noted in the HPI. Denies N/V/F/Ch.  Past Medical History:  Diagnosis Date   Acute medial meniscus tear    DM (diabetes mellitus), type 2 (HCC)    Gout    Hyperlipemia    Hypertension    Over weight    Sedentary lifestyle    Sleep apnea    no cpap   Vertigo    Vertigo     Current Outpatient Medications:    allopurinol (ZYLOPRIM) 300 MG tablet, Take 1 tablet (300 mg total) by mouth daily., Disp: 90 tablet, Rfl: 2   amLODipine (NORVASC) 10 MG tablet, Take 1 tablet (10 mg total) by mouth daily., Disp: 90 tablet, Rfl: 3   bisoprolol (ZEBETA) 10 MG tablet, Take 1 tablet (10 mg total) by mouth daily., Disp: 90 tablet, Rfl: 3   metFORMIN (GLUCOPHAGE) 500 MG tablet, Take 1 tablet (500 mg total) by mouth 2 (two) times daily with a meal., Disp: 180 tablet, Rfl: 3   sildenafil (VIAGRA) 25 MG tablet, Take 25 mg by mouth daily as needed for erectile dysfunction. Prostate removed, Disp: , Rfl:   Social History   Tobacco Use  Smoking Status Some Days   Types: Cigars   Passive exposure: Current  Smokeless Tobacco Never    No Known Allergies Objective:  There were no vitals filed for this visit. Body mass index is 28.74 kg/m. Constitutional Well  developed. Well nourished.  Vascular Dorsalis pedis pulses palpable bilaterally. Posterior tibial pulses palpable bilaterally. Capillary refill normal to all digits.  No cyanosis or clubbing noted. Pedal hair growth normal.  Neurologic Normal speech. Oriented to person, place, and time. Epicritic sensation to light touch grossly present bilaterally.  Dermatologic Nails well groomed and normal in appearance. No open wounds. No skin lesions.  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   Radiographs: 3 views of skeletally mature adult right foot: Mild osteoarthritis of the first metatarsophalangeal joint noted as well as bunion deformity mild.  No other abnormalities identified Assessment:   1. Foot deformity, bilateral    Plan:  Patient was evaluated and treated and all questions answered.  Pes planovalgus/foot deformity -I explained to patient the etiology of pes planovalgus and relationship with Planter fasciitis and various treatment options were discussed.  Given patient foot structure in the setting of Planter fasciitis I believe patient will benefit from custom-made orthotics to help control the hindfoot motion support the arch of the foot and take the stress away from plantar fascial.  Patient agrees  with the plan like to proceed with orthotics -Patient was casted for orthotics   No follow-ups on file.

## 2023-01-18 ENCOUNTER — Telehealth: Payer: Self-pay

## 2023-01-18 NOTE — Telephone Encounter (Signed)
 Taking inserts to [place] 1/10

## 2023-01-22 DIAGNOSIS — C61 Malignant neoplasm of prostate: Secondary | ICD-10-CM | POA: Diagnosis not present

## 2023-02-01 ENCOUNTER — Ambulatory Visit (INDEPENDENT_AMBULATORY_CARE_PROVIDER_SITE_OTHER): Payer: 59 | Admitting: Podiatry

## 2023-02-01 ENCOUNTER — Encounter: Payer: Self-pay | Admitting: Podiatry

## 2023-02-01 DIAGNOSIS — M21962 Unspecified acquired deformity of left lower leg: Secondary | ICD-10-CM

## 2023-02-01 DIAGNOSIS — M21961 Unspecified acquired deformity of right lower leg: Secondary | ICD-10-CM

## 2023-02-01 NOTE — Patient Instructions (Addendum)

## 2023-02-01 NOTE — Progress Notes (Signed)
Patient presents today to pick up custom molded foot orthotics recommended by Dr. Allena Katz.   Orthotics were dispensed and fit was satisfactory. Reviewed instructions for break-in and wear. Written instructions given to patient.  Patient will follow up as needed.  Laurence Ferrari - [id number]

## 2023-05-04 ENCOUNTER — Other Ambulatory Visit: Payer: Self-pay

## 2023-05-04 ENCOUNTER — Emergency Department

## 2023-05-04 ENCOUNTER — Inpatient Hospital Stay
Admission: EM | Admit: 2023-05-04 | Discharge: 2023-05-06 | DRG: 494 | Disposition: A | Discharge status: Home / Self Care | Attending: Student in an Organized Health Care Education/Training Program | Admitting: Student in an Organized Health Care Education/Training Program

## 2023-05-04 DIAGNOSIS — M109 Gout, unspecified: Secondary | ICD-10-CM | POA: Diagnosis present

## 2023-05-04 DIAGNOSIS — Y9353 Activity, golf: Secondary | ICD-10-CM | POA: Diagnosis not present

## 2023-05-04 DIAGNOSIS — S93422A Sprain of deltoid ligament of left ankle, initial encounter: Secondary | ICD-10-CM | POA: Diagnosis present

## 2023-05-04 DIAGNOSIS — Z7984 Long term (current) use of oral hypoglycemic drugs: Secondary | ICD-10-CM | POA: Diagnosis not present

## 2023-05-04 DIAGNOSIS — Z823 Family history of stroke: Secondary | ICD-10-CM

## 2023-05-04 DIAGNOSIS — S93402A Sprain of unspecified ligament of left ankle, initial encounter: Secondary | ICD-10-CM | POA: Diagnosis not present

## 2023-05-04 DIAGNOSIS — Z79899 Other long term (current) drug therapy: Secondary | ICD-10-CM

## 2023-05-04 DIAGNOSIS — Z8546 Personal history of malignant neoplasm of prostate: Secondary | ICD-10-CM | POA: Diagnosis not present

## 2023-05-04 DIAGNOSIS — E119 Type 2 diabetes mellitus without complications: Secondary | ICD-10-CM

## 2023-05-04 DIAGNOSIS — E785 Hyperlipidemia, unspecified: Secondary | ICD-10-CM | POA: Diagnosis present

## 2023-05-04 DIAGNOSIS — G4730 Sleep apnea, unspecified: Secondary | ICD-10-CM | POA: Diagnosis not present

## 2023-05-04 DIAGNOSIS — S82842A Displaced bimalleolar fracture of left lower leg, initial encounter for closed fracture: Secondary | ICD-10-CM | POA: Diagnosis not present

## 2023-05-04 DIAGNOSIS — S99912A Unspecified injury of left ankle, initial encounter: Secondary | ICD-10-CM | POA: Diagnosis not present

## 2023-05-04 DIAGNOSIS — S82892A Other fracture of left lower leg, initial encounter for closed fracture: Secondary | ICD-10-CM | POA: Diagnosis not present

## 2023-05-04 DIAGNOSIS — I10 Essential (primary) hypertension: Secondary | ICD-10-CM | POA: Diagnosis not present

## 2023-05-04 DIAGNOSIS — W010XXA Fall on same level from slipping, tripping and stumbling without subsequent striking against object, initial encounter: Secondary | ICD-10-CM | POA: Diagnosis present

## 2023-05-04 DIAGNOSIS — Z8249 Family history of ischemic heart disease and other diseases of the circulatory system: Secondary | ICD-10-CM | POA: Diagnosis not present

## 2023-05-04 DIAGNOSIS — Z9079 Acquired absence of other genital organ(s): Secondary | ICD-10-CM | POA: Diagnosis not present

## 2023-05-04 DIAGNOSIS — S82899A Other fracture of unspecified lower leg, initial encounter for closed fracture: Secondary | ICD-10-CM | POA: Diagnosis present

## 2023-05-04 DIAGNOSIS — C61 Malignant neoplasm of prostate: Secondary | ICD-10-CM | POA: Diagnosis present

## 2023-05-04 DIAGNOSIS — F17290 Nicotine dependence, other tobacco product, uncomplicated: Secondary | ICD-10-CM | POA: Diagnosis not present

## 2023-05-04 DIAGNOSIS — S82402A Unspecified fracture of shaft of left fibula, initial encounter for closed fracture: Secondary | ICD-10-CM | POA: Diagnosis not present

## 2023-05-04 DIAGNOSIS — R600 Localized edema: Secondary | ICD-10-CM | POA: Diagnosis not present

## 2023-05-04 DIAGNOSIS — S82432A Displaced oblique fracture of shaft of left fibula, initial encounter for closed fracture: Secondary | ICD-10-CM | POA: Diagnosis not present

## 2023-05-04 LAB — CBC WITH DIFFERENTIAL/PLATELET
Abs Immature Granulocytes: 0.04 10*3/uL (ref 0.00–0.07)
Basophils Absolute: 0 10*3/uL (ref 0.0–0.1)
Basophils Relative: 0 %
Eosinophils Absolute: 0 10*3/uL (ref 0.0–0.5)
Eosinophils Relative: 0 %
HCT: 38.7 % — ABNORMAL LOW (ref 39.0–52.0)
Hemoglobin: 13.4 g/dL (ref 13.0–17.0)
Immature Granulocytes: 0 %
Lymphocytes Relative: 16 %
Lymphs Abs: 1.7 10*3/uL (ref 0.7–4.0)
MCH: 32.6 pg (ref 26.0–34.0)
MCHC: 34.6 g/dL (ref 30.0–36.0)
MCV: 94.2 fL (ref 80.0–100.0)
Monocytes Absolute: 0.7 10*3/uL (ref 0.1–1.0)
Monocytes Relative: 6 %
Neutro Abs: 8 10*3/uL — ABNORMAL HIGH (ref 1.7–7.7)
Neutrophils Relative %: 78 %
Platelets: 220 10*3/uL (ref 150–400)
RBC: 4.11 MIL/uL — ABNORMAL LOW (ref 4.22–5.81)
RDW: 12.9 % (ref 11.5–15.5)
WBC: 10.4 10*3/uL (ref 4.0–10.5)
nRBC: 0 % (ref 0.0–0.2)

## 2023-05-04 LAB — BASIC METABOLIC PANEL WITH GFR
Anion gap: 15 (ref 5–15)
BUN: 15 mg/dL (ref 8–23)
CO2: 21 mmol/L — ABNORMAL LOW (ref 22–32)
Calcium: 8.8 mg/dL — ABNORMAL LOW (ref 8.9–10.3)
Chloride: 102 mmol/L (ref 98–111)
Creatinine, Ser: 1.14 mg/dL (ref 0.61–1.24)
GFR, Estimated: >60 mL/min (ref >60)
Glucose, Bld: 208 mg/dL — ABNORMAL HIGH (ref 70–99)
Potassium: 3.6 mmol/L (ref 3.5–5.1)
Sodium: 138 mmol/L (ref 135–145)

## 2023-05-04 LAB — HEMOGLOBIN A1C
Hgb A1c MFr Bld: 7.1 % — ABNORMAL HIGH (ref 4.8–5.6)
Mean Plasma Glucose: 157.07 mg/dL

## 2023-05-04 LAB — GLUCOSE, CAPILLARY: Glucose-Capillary: 217 mg/dL — ABNORMAL HIGH (ref 70–99)

## 2023-05-04 MED ORDER — INSULIN ASPART 100 UNIT/ML IJ SOLN
0.0000 [IU] | INTRAMUSCULAR | Status: DC
  Administered 2023-05-04 – 2023-05-05 (×3): 5 [IU] via SUBCUTANEOUS
  Filled 2023-05-04 (×3): qty 1

## 2023-05-04 MED ORDER — KETAMINE HCL 50 MG/5ML IJ SOSY
1.0000 mg/kg | PREFILLED_SYRINGE | Freq: Once | INTRAMUSCULAR | Status: DC
  Filled 2023-05-04: qty 10

## 2023-05-04 MED ORDER — KETOROLAC TROMETHAMINE 30 MG/ML IJ SOLN
30.0000 mg | Freq: Four times a day (QID) | INTRAMUSCULAR | Status: DC | PRN
Start: 2023-05-04 — End: 2023-05-09
  Filled 2023-05-04: qty 1

## 2023-05-04 MED ORDER — ACETAMINOPHEN 500 MG PO TABS
1000.0000 mg | ORAL_TABLET | Freq: Once | ORAL | Status: AC
  Administered 2023-05-04: 1000 mg via ORAL
  Filled 2023-05-04: qty 2

## 2023-05-04 MED ORDER — ONDANSETRON HCL 4 MG/2ML IJ SOLN
4.0000 mg | Freq: Four times a day (QID) | INTRAMUSCULAR | Status: DC | PRN
Start: 2023-05-04 — End: 2023-05-06
  Administered 2023-05-04: 4 mg via INTRAVENOUS
  Filled 2023-05-04: qty 2

## 2023-05-04 MED ORDER — MORPHINE SULFATE (PF) 4 MG/ML IV SOLN
4.0000 mg | Freq: Once | INTRAVENOUS | Status: AC
  Administered 2023-05-04: 4 mg via INTRAVENOUS
  Filled 2023-05-04: qty 1

## 2023-05-04 MED ORDER — ONDANSETRON HCL 4 MG/2ML IJ SOLN
4.0000 mg | Freq: Once | INTRAMUSCULAR | Status: AC
  Administered 2023-05-04: 4 mg via INTRAVENOUS
  Filled 2023-05-04: qty 2

## 2023-05-04 MED ORDER — KETAMINE HCL 10 MG/ML IJ SOLN
INTRAMUSCULAR | Status: AC | PRN
  Administered 2023-05-04: 89 mg via INTRAVENOUS

## 2023-05-04 MED ORDER — MORPHINE SULFATE (PF) 2 MG/ML IV SOLN: 2.0000 mg | INTRAVENOUS | Status: DC | PRN

## 2023-05-04 MED ORDER — HYDROMORPHONE HCL 1 MG/ML IJ SOLN
1.0000 mg | Freq: Once | INTRAMUSCULAR | Status: AC
  Administered 2023-05-04: 1 mg via INTRAVENOUS
  Filled 2023-05-04: qty 1

## 2023-05-04 MED ORDER — ACETAMINOPHEN 325 MG PO TABS
650.0000 mg | ORAL_TABLET | Freq: Four times a day (QID) | ORAL | Status: DC | PRN
  Administered 2023-05-05: 650 mg via ORAL
  Filled 2023-05-04: qty 2

## 2023-05-04 MED ORDER — CEFAZOLIN SODIUM-DEXTROSE 2-4 GM/100ML-% IV SOLN
2.0000 g | INTRAVENOUS | Status: AC
  Administered 2023-05-05: 2 g via INTRAVENOUS

## 2023-05-04 MED ORDER — ONDANSETRON HCL 4 MG PO TABS
4.0000 mg | ORAL_TABLET | Freq: Four times a day (QID) | ORAL | Status: DC | PRN
Start: 2023-05-04 — End: 2023-05-06

## 2023-05-04 MED ORDER — OXYCODONE HCL 5 MG PO TABS: 5.0000 mg | ORAL_TABLET | ORAL | Status: DC | PRN

## 2023-05-04 MED ORDER — ACETAMINOPHEN 650 MG RE SUPP: 650.0000 mg | Freq: Four times a day (QID) | RECTAL | Status: DC | PRN

## 2023-05-04 NOTE — Assessment & Plan Note (Signed)
Does not currently use CPAP

## 2023-05-04 NOTE — Sedation Documentation (Signed)
 Pt became hypoxic with sats dropping into the 70's. Pt was placed on 15 liters of oxygen. Pts sats returned to normal.

## 2023-05-04 NOTE — ED Provider Notes (Signed)
 Patient transferred to my care for ankle reduction. Discussed and consented patient for sedation and reduction.  .Sedation  Date/Time: 05/04/2023 6:30 PM  Performed by: Marylynn Soho, MD Authorized by: Marylynn Soho, MD   Consent:    Consent obtained:  Written   Consent given by:  Patient   Risks discussed:  Nausea, vomiting, prolonged hypoxia resulting in organ damage and respiratory compromise necessitating ventilatory assistance and intubation Universal protocol:    Immediately prior to procedure, a time out was called: yes   Pre-sedation assessment:    Time since last food or drink:  Unknown   ASA classification: class 2 - patient with mild systemic disease     Mallampati score:  II - soft palate, uvula, fauces visible   Pre-sedation assessments completed and reviewed: airway patency, cardiovascular function, hydration status, mental status, pain level and respiratory function   A pre-sedation assessment was completed prior to the start of the procedure Procedure details (see MAR for exact dosages):    Sedation:  Ketamine    Intended level of sedation: deep   Total Provider sedation time (minutes):  15 Post-procedure details:   A post-sedation assessment was completed following the completion of the procedure.  Reduction of dislocation/fracture Performed by: Marylynn Soho Authorized by: Marylynn Soho Consent: Verbal consent obtained. Risks and benefits: risks, benefits and alternatives were discussed Consent given by: patient Required items: required blood products, implants, devices, and special equipment available Time out: Immediately prior to procedure a "time out" was called to verify the correct patient, procedure, equipment, support staff and site/side marked as required.  Patient sedated: Ketamine   Vitals: Vital signs were monitored during sedation. Patient tolerance: Patient tolerated the procedure well with no immediate complications. Joint: left  ankle Reduction technique: traction  Postreduction x-ray shows improved alignment however still lateral displacement.  Discussed with Dr. Larey Plenty with podiatry.  Plan will be for patient to be admitted to the hospitalist service for operating room tomorrow.  Discussed plan with patient.     Marylynn Soho, MD 05/04/23 [phone]

## 2023-05-04 NOTE — Hospital Course (Signed)
 Tom Edwards

## 2023-05-04 NOTE — Assessment & Plan Note (Signed)
 Sliding scale insulin coverage

## 2023-05-04 NOTE — Assessment & Plan Note (Signed)
 Followed by [facility] No acute issues suspected

## 2023-05-04 NOTE — ED Notes (Signed)
 Pt provided with pitcher of ice water. Pt verbalized understanding that he is NPO after midnight.

## 2023-05-04 NOTE — ED Notes (Signed)
 Paper consent signed by pt

## 2023-05-04 NOTE — ED Notes (Signed)
 Pt provided with water. No other comfort measures requested at this time.

## 2023-05-04 NOTE — Assessment & Plan Note (Signed)
 S/p reduction in the ED Pain control No weightbearing N.p.o. from midnight for repair in the a.m. On-call podiatry, Dr. Larey Plenty consulted

## 2023-05-04 NOTE — ED Provider Notes (Signed)
 [facility] Provider Note    Event Date/Time   First MD Initiated Contact with Patient 05/04/23 1257     (approximate)   History   Ankle Pain   HPI  Tom Edwards is a 63 y.o. male  with history of hypertension, hyperlipidemia, type 2 diabetes and as listed in EMR presents to the emergency department for evaluation of left ankle pain.  While playing golf today his foot slipped and inverted.  He did fall but denies any loss of consciousness or striking his head.  No recalled previous injury to the left ankle.  Obvious deformity is noted.      Physical Exam   Triage Vital Signs: ED Triage Vitals  Encounter Vitals Group     BP 05/04/23 1136 (!) 155/89     Systolic BP Percentile --      Diastolic BP Percentile --      Pulse Rate 05/04/23 1136 84     Resp 05/04/23 1136 18     Temp 05/04/23 1136 98 F (36.7 C)     Temp src --      SpO2 05/04/23 1136 100 %     Weight 05/04/23 1134 195 lb (88.5 kg)     Height 05/04/23 1134 5\' 9"  (1.753 m)     Head Circumference --      Peak Flow --      Pain Score 05/04/23 1134 10     Pain Loc --      Pain Education --      Exclude from Growth Chart --     Most recent vital signs: Vitals:   05/04/23 1136  BP: (!) 155/89  Pulse: 84  Resp: 18  Temp: 98 F (36.7 C)  SpO2: 100%    General: Awake, no distress.  CV:  Good peripheral perfusion.  Resp:  Normal effort.  Abd:  No distention.  Other:  Left ankle deformity noted.  Motor and sensory function is present.  2+ DP pulse.   ED Results / Procedures / Treatments   Labs (all labs ordered are listed, but only abnormal results are displayed) Labs Reviewed - No data to display   EKG  Not indicated   RADIOLOGY  Image and radiology report reviewed and interpreted by me. Radiology report consistent with the same.  X-ray of the left ankle shows severely displaced distal left fibular fracture with moderate lateral talotibial dislocation.  CT of  the left ankle shows significant lateral subluxation of the talus in relation to the tibia with marked medial mortise widening consistent with medial ligamentous disruption.  Long oblique coursing displaced fibular fracture at and above the level of the ankle mortise.  Large avulsion fracture noted involving the distal anterior aspect of the fibula.  Small impaction type fracture involving the posterior inferior lip of the distal tibia with small bone fragments and minimal disruption of the very posterior articular surface.  Extensive subcutaneous soft tissue swelling/edema/hematoma associated with fractures.  PROCEDURES:  Critical Care performed: No  Procedures   MEDICATIONS ORDERED IN ED:  Medications  morphine  (PF) 4 MG/ML injection 4 mg (4 mg Intravenous Given 05/04/23 1328)  ondansetron  (ZOFRAN ) injection 4 mg (4 mg Intravenous Given 05/04/23 1328)  HYDROmorphone  (DILAUDID ) injection 1 mg (1 mg Intravenous Given 05/04/23 1452)     IMPRESSION / MDM / ASSESSMENT AND PLAN / ED COURSE   I have reviewed the triage note.  Differential diagnosis includes, but is not limited to, bimalleolar fracture, trimalleolar  fracture, foot fracture  Patient's presentation is most consistent with acute presentation with potential threat to life or bodily function.  64 year old male presenting to the emergency department after inversion of the left ankle while playing golf.  See HPI for further details.  On exam, he does have deformity of the left ankle.  Motor and sensory function is intact and he has good pulses.  Stirrup splint applied for support and pain medication ordered.  Imaging shows complex fracture of the fibula with moderate lateral talotibial dislocation.  CT ordered.  Results reviewed with the patient.  Clinical Course as of 05/04/23 1553  Fri May 04, 2023  1440 Consulted with podiatry who is unable to view imaging at this time.  Orthopedist consult requested. [CT]  1443 Consulted Dr.  Lydia Sams with orthopedics who recommends reduction and then touching base again with podiatry.  Results discussed with the patient [CT]  1453 Discussed with ED attending, Dr. Felipe Horton who has ordered Dilaudid  and together we will reduce and apply splint. [CT]  1552 Reduction not successful. Patient will require sedation. Care transferred to Dr. Felipe Horton. [CT]    Clinical Course User Index [CT] Douglas Rooks B, FNP     FINAL CLINICAL IMPRESSION(S) / ED DIAGNOSES   Final diagnoses:  Closed bimalleolar fracture of left ankle, initial encounter     Rx / DC Orders   ED Discharge Orders     None        Note:  This document was prepared using Dragon voice recognition software and may include unintentional dictation errors.   Sherryle Don, FNP 05/04/23 1553    Arline Bennett, MD 05/04/23 1556

## 2023-05-04 NOTE — ED Notes (Signed)
 See triage note  Presents with injury to left ankle  States he rolled his ankle while playing golf  Positive deformity with some swelling  Good pulses

## 2023-05-04 NOTE — H&P (Signed)
 History and Physical    Patient: Tom Edwards OZH:[id number] DOB: 04-28-1960 DOA: 05/04/2023 DOS: the patient was seen and examined on 05/04/2023 PCP: Marcina Severe, PA-C  Patient coming from: Home  Chief Complaint:  Chief Complaint  Patient presents with   Ankle Pain    HPI: Tom Edwards is a 63 y.o. male with medical history significant for Prostate cancer s/p laparoscopic prostatectomy, DM, HTN, HLD and gout being admitted for an accidental left ankle fracture that he sustained while playing golf.  The injury occurred when he slipped as he was about to swing causing inversion to the ankle.  He fell but did not hit his head and did not lose consciousness.  He experienced immediate pain of the ankle. ED course and data review: BP 155/89 with otherwise normal vitals Labs notable for elevated blood sugar of 208 but BMP and CBC otherwise unremarkable.EKG, personally viewed and interpreted showing sinus at 77 with no acute ST-T wave changes. CT of the ankle showed a large avulsion fracture involving the distal anterior aspect of the fibula along with several other findings. The ED provider spoke with podiatry who will take patient to the OR on 4/26.  Ankle was reduced under procedural sedation. Hospitalist consulted for admission.   Review of Systems: As mentioned in the history of present illness. All other systems reviewed and are negative.  Past Medical History:  Diagnosis Date   Acute medial meniscus tear    DM (diabetes mellitus), type 2 (HCC)    Gout    Hyperlipemia    Hypertension    Over weight    Sedentary lifestyle    Sleep apnea    no cpap   Vertigo    Vertigo    Past Surgical History:  Procedure Laterality Date   COLONOSCOPY     COLONOSCOPY WITH PROPOFOL  N/A 02/11/2021   Procedure: COLONOSCOPY WITH PROPOFOL ;  Surgeon: Shane Darling, MD;  Location: [facility] END[facility];  Service: Endoscopy;  Laterality: N/A;   INGUINAL HERNIA REPAIR Left 07/24/2018   Procedure:  HERNIA REPAIR INGUINAL ADULT, LEFT OPEN;  Surgeon: Marshall Skeeter, MD;  Location: [facility] ORS;  Service: General;  Laterality: Left;   Social History:  reports that he has been smoking cigars. He has been exposed to tobacco smoke. He has never used smokeless tobacco. He reports current alcohol use of about 10.0 standard drinks of alcohol per week. He reports that he does not use drugs.  No Known Allergies  Family History  Problem Relation Age of Onset   Diabetes Mother    Cerebrovascular Accident Mother    Prostate cancer Father    Hypertension Father    Diabetes Brother     Prior to Admission medications   Medication Sig Start Date End Date Taking? Authorizing Provider  allopurinol  (ZYLOPRIM ) 300 MG tablet Take 1 tablet (300 mg total) by mouth daily. 02/09/22  Yes Marcina Severe, PA-C  amLODipine  (NORVASC ) 10 MG tablet Take 1 tablet (10 mg total) by mouth daily. 12/20/22  Yes Marcina Severe, PA-C  bisoprolol  (ZEBETA ) 10 MG tablet Take 1 tablet (10 mg total) by mouth daily. 08/28/22  Yes Marcina Severe, PA-C  metFORMIN  (GLUCOPHAGE ) 500 MG tablet Take 1 tablet (500 mg total) by mouth 2 (two) times daily with a meal. 12/04/22  Yes Marcina Severe, PA-C  sildenafil (VIAGRA) 25 MG tablet Take 25 mg by mouth daily as needed for erectile dysfunction. Prostate removed   Yes [provider]  Physical Exam: Vitals:   05/04/23 1915 05/04/23 2000 05/04/23 2015 05/04/23 2049  BP: 124/82 (!) 135/91 117/66 124/84  Pulse: 74 81 78 76  Resp: 12  16 15   Temp:   98.4 F (36.9 C) 98.3 F (36.8 C)  TempSrc:   Oral   SpO2: 95% 96% 98% 99%  Weight:    92.5 kg  Height:    5\' 9"  (1.753 m)   Physical Exam Vitals and nursing note reviewed.  Constitutional:      General: He is not in acute distress. HENT:     Head: Normocephalic and atraumatic.  Cardiovascular:     Rate and Rhythm: Normal rate and regular rhythm.     Heart sounds: Normal heart sounds.  Pulmonary:     Effort:  Pulmonary effort is normal.     Breath sounds: Normal breath sounds.  Abdominal:     Palpations: Abdomen is soft.     Tenderness: There is no abdominal tenderness.  Musculoskeletal:     Comments: Left lower leg in splint  Neurological:     Mental Status: Mental status is at baseline.     Labs on Admission: I have personally reviewed following labs and imaging studies  CBC: Recent Labs  Lab 05/04/23 1817  WBC 10.4  NEUTROABS 8.0*  HGB 13.4  HCT 38.7*  MCV 94.2  PLT 220   Basic Metabolic Panel: Recent Labs  Lab 05/04/23 1817  NA 138  K 3.6  CL 102  CO2 21*  GLUCOSE 208*  BUN 15  CREATININE 1.14  CALCIUM  8.8*   GFR: Estimated Creatinine Clearance: 74.5 mL/min (by C-G formula based on SCr of 1.14 mg/dL). Liver Function Tests: No results for input(s): "AST", "ALT", "ALKPHOS", "BILITOT", "PROT", "ALBUMIN" in the last 168 hours. No results for input(s): "LIPASE", "AMYLASE" in the last 168 hours. No results for input(s): "AMMONIA" in the last 168 hours. Coagulation Profile: No results for input(s): "INR", "PROTIME" in the last 168 hours. Cardiac Enzymes: No results for input(s): "CKTOTAL", "CKMB", "CKMBINDEX", "TROPONINI" in the last 168 hours. BNP (last 3 results) No results for input(s): "PROBNP" in the last 8760 hours. HbA1C: No results for input(s): "HGBA1C" in the last 72 hours. CBG: Recent Labs  Lab 05/04/23 2122  GLUCAP 217*   Lipid Profile: No results for input(s): "CHOL", "HDL", "LDLCALC", "TRIG", "CHOLHDL", "LDLDIRECT" in the last 72 hours. Thyroid Function Tests: No results for input(s): "TSH", "T4TOTAL", "FREET4", "T3FREE", "THYROIDAB" in the last 72 hours. Anemia Panel: No results for input(s): "VITAMINB12", "FOLATE", "FERRITIN", "TIBC", "IRON", "RETICCTPCT" in the last 72 hours. Urine analysis:    Component Value Date/Time   BILIRUBINUR neg 05/03/2022 0921   PROTEINUR Negative 05/03/2022 0921   UROBILINOGEN 0.2 05/03/2022 0921   NITRITE neg  05/03/2022 0921   LEUKOCYTESUR Negative 05/03/2022 0921    Radiological Exams on Admission: DG Ankle 2 Views Left Result Date: 05/04/2023 CLINICAL DATA:  Post reduction EXAM: LEFT ANKLE - 2 VIEW COMPARISON:  Left ankle x-ray 05/04/2023 FINDINGS: Again seen is an acute oblique fracture through the distal fibula. There is posterior distraction of the distal fracture fragment 1 cm, minimally improved from prior. There is minimal widening of the medial talotibial joint space which has also improved. IMPRESSION: Minimally improved alignment of the distal fibular fracture. There is diffuse soft tissue swelling of the ankle. Electronically Signed   By: Tyron Gallon M.D.   On: 05/04/2023 18:30   DG Ankle 2 Views Left Result Date: 05/04/2023 CLINICAL DATA:  Status  post reduction distal tibia and fibula fractures and subluxation. EXAM: LEFT ANKLE - 2 VIEW COMPARISON:  Radiographs and CT obtained earlier today. FINDINGS: Interval fiberglass splint without significant change in position and alignment of the previously described distal fibula and tibia fractures with subluxation. IMPRESSION: Interval fiberglass splint without significant change in position and alignment of the previously described distal fibula and tibia fractures with subluxation. Electronically Signed   By: Catherin Closs M.D.   On: 05/04/2023 16:19   CT Ankle Left Wo Contrast Result Date: 05/04/2023 CLINICAL DATA:  Marvell Slider.  Complex ankle fractures. EXAM: CT OF THE LEFT ANKLE WITHOUT CONTRAST TECHNIQUE: Multidetector CT imaging of the left ankle was performed according to the standard protocol. Multiplanar CT image reconstructions were also generated. RADIATION DOSE REDUCTION: This exam was performed according to the departmental dose-optimization program which includes automated exposure control, adjustment of the mA and/or kV according to patient size and/or use of iterative reconstruction technique. COMPARISON:  Radiographs, same date. FINDINGS:  Significant lateral subluxation of the talus in relation to the tibia with marked medial mortise widening consistent with medial ligamentous disruption. Long oblique coursing displaced distal fibular fracture at and above the level of the ankle mortise. Maximum displacement is 12 mm. There is also a large avulsion fracture involving the distal anterior aspect of the fibula. This is displaced approximately 12 mm. The medial malleolus is intact. There is a small impaction type fracture involving the posteroinferior lip of the distal tibia with small bone fragments and minimal disruption of the very posterior articular surface. Evidence of a prior tibiofibular syndesmotic injury with heavy calcifications in the membrane. The talus is intact. Mild subchondral cystic changes are noted. The subtalar joints are maintained in the sinus tarsi is normal. No mid or hindfoot fractures are identified. Extensive subcutaneous soft tissue swelling/edema/hematoma associated with the fractures. IMPRESSION: 1. Significant lateral subluxation of the talus in relation to the tibia with marked medial mortise widening consistent with medial ligamentous disruption. 2. Long oblique coursing displaced distal fibular fracture at and above the level of the ankle mortise. 3. Large avulsion fracture involving the distal anterior aspect of the fibula. 4. Small impaction type fracture involving the posteroinferior lip of the distal tibia with small bone fragments and minimal disruption of the very posterior articular surface. 5. Evidence of a prior tibiofibular syndesmotic injury with heavy calcifications in the membrane. 6. Extensive subcutaneous soft tissue swelling/edema/hematoma associated with the fractures. Electronically Signed   By: Marrian Siva M.D.   On: 05/04/2023 14:21   DG Ankle Complete Left Result Date: 05/04/2023 CLINICAL DATA:  Left ankle injury after fall. EXAM: LEFT ANKLE COMPLETE - 3+ VIEW COMPARISON:  April 23, 2006.  FINDINGS: Moderate lateral talotibial dislocation is noted with severely displaced distal left fibular oblique fracture. IMPRESSION: Severely displaced distal left fibular fracture with moderate lateral talotibial dislocation. Electronically Signed   By: Rosalene Colon M.D.   On: 05/04/2023 12:53     Data Reviewed: Relevant notes from primary care and specialist visits, past discharge summaries as available in EHR, including Care Everywhere. Prior diagnostic testing as pertinent to current admission diagnoses Updated medications and problem lists for reconciliation ED course, including vitals, labs, imaging, treatment and response to treatment Triage notes, nursing and pharmacy notes and ED provider's notes Notable results as noted in HPI   Assessment and Plan: * Closed left ankle fracture, initial encounter S/p reduction in the ED Pain control No weightbearing N.p.o. from midnight for repair in the a.m.  On-call podiatry, Dr. Larey Plenty consulted  Type II diabetes mellitus (HCC) Sliding scale insulin  coverage  Sleep apnea Does not currently use CPAP  Cancer of prostate with intermediate recurrence risk, stage T2B-C or Gleason 7 or prostate-specific antigen (PSA) 10-20 (HCC) Followed by [facility] No acute issues suspected    DVT prophylaxis: SCD  Consults: podiatry, Dr Larey Plenty  Advance Care Planning:   Code Status: Full Code   Family Communication: none  Disposition Plan: Back to previous home environment  Severity of Illness: The appropriate patient status for this patient is INPATIENT. Inpatient status is judged to be reasonable and necessary in order to provide the required intensity of service to ensure the patient's safety. The patient's presenting symptoms, physical exam findings, and initial radiographic and laboratory data in the context of their chronic comorbidities is felt to place them at high risk for further clinical deterioration. Furthermore, it is not anticipated that  the patient will be medically stable for discharge from the hospital within 2 midnights of admission.   * I certify that at the point of admission it is my clinical judgment that the patient will require inpatient hospital care spanning beyond 2 midnights from the point of admission due to high intensity of service, high risk for further deterioration and high frequency of surveillance required.*  Author: Lanetta Pion, MD 05/04/2023 9:53 PM  For on call review [email or web address].

## 2023-05-04 NOTE — ED Triage Notes (Signed)
 Pt comes with c/o left ankle pain. Pt states he was playing golf and went to swing the club and slipped. Pt fell and denies any loc or hitting head. Pt states his ankle turned.

## 2023-05-05 ENCOUNTER — Inpatient Hospital Stay

## 2023-05-05 ENCOUNTER — Encounter
Admission: EM | Disposition: A | Discharge status: Home / Self Care | Payer: Self-pay | Source: Home / Self Care | Attending: Student in an Organized Health Care Education/Training Program

## 2023-05-05 ENCOUNTER — Inpatient Hospital Stay: Admitting: General Practice

## 2023-05-05 ENCOUNTER — Other Ambulatory Visit: Payer: Self-pay

## 2023-05-05 DIAGNOSIS — S82892A Other fracture of left lower leg, initial encounter for closed fracture: Secondary | ICD-10-CM | POA: Diagnosis not present

## 2023-05-05 HISTORY — PX: ORIF ANKLE FRACTURE: SHX5408

## 2023-05-05 LAB — TYPE AND SCREEN
ABO/RH(D): O POS
Antibody Screen: NEGATIVE

## 2023-05-05 LAB — GLUCOSE, CAPILLARY
Glucose-Capillary: 114 mg/dL — ABNORMAL HIGH (ref 70–99)
Glucose-Capillary: 165 mg/dL — ABNORMAL HIGH (ref 70–99)
Glucose-Capillary: 220 mg/dL — ABNORMAL HIGH (ref 70–99)
Glucose-Capillary: 234 mg/dL — ABNORMAL HIGH (ref 70–99)
Glucose-Capillary: 262 mg/dL — ABNORMAL HIGH (ref 70–99)

## 2023-05-05 LAB — HIV ANTIBODY (ROUTINE TESTING W REFLEX): HIV Screen 4th Generation wRfx: NONREACTIVE

## 2023-05-05 SURGERY — OPEN REDUCTION INTERNAL FIXATION (ORIF) ANKLE FRACTURE
Anesthesia: General | Site: Ankle | Laterality: Left

## 2023-05-05 MED ORDER — MIDAZOLAM HCL 2 MG/2ML IJ SOLN
INTRAMUSCULAR | Status: AC
  Filled 2023-05-05: qty 2

## 2023-05-05 MED ORDER — EPHEDRINE SULFATE-NACL 50-0.9 MG/10ML-% IV SOSY
PREFILLED_SYRINGE | INTRAVENOUS | Status: DC | PRN
  Administered 2023-05-05: 5 mg via INTRAVENOUS
  Administered 2023-05-05: 10 mg via INTRAVENOUS
  Administered 2023-05-05: 5 mg via INTRAVENOUS

## 2023-05-05 MED ORDER — LACTATED RINGERS IV SOLN: INTRAVENOUS | Status: DC | PRN

## 2023-05-05 MED ORDER — ACETAMINOPHEN 10 MG/ML IV SOLN
INTRAVENOUS | Status: AC
  Filled 2023-05-05: qty 100

## 2023-05-05 MED ORDER — EPHEDRINE 5 MG/ML INJ
INTRAVENOUS | Status: AC
  Filled 2023-05-05: qty 5

## 2023-05-05 MED ORDER — BUPIVACAINE LIPOSOME 1.3 % IJ SUSP
INTRAMUSCULAR | Status: DC | PRN
  Administered 2023-05-05 (×2): 10 mL

## 2023-05-05 MED ORDER — FENTANYL CITRATE (PF) 100 MCG/2ML IJ SOLN
INTRAMUSCULAR | Status: DC | PRN
Start: 2023-05-05 — End: 2023-05-05
  Administered 2023-05-05 (×4): 50 ug via INTRAVENOUS

## 2023-05-05 MED ORDER — PHENYLEPHRINE 80 MCG/ML (10ML) SYRINGE FOR IV PUSH (FOR BLOOD PRESSURE SUPPORT)
PREFILLED_SYRINGE | INTRAVENOUS | Status: DC | PRN
  Administered 2023-05-05: 160 ug via INTRAVENOUS
  Administered 2023-05-05: 80 ug via INTRAVENOUS
  Administered 2023-05-05: 160 ug via INTRAVENOUS
  Administered 2023-05-05 (×2): 80 ug via INTRAVENOUS

## 2023-05-05 MED ORDER — ACETAMINOPHEN 10 MG/ML IV SOLN
INTRAVENOUS | Status: DC | PRN
  Administered 2023-05-05: 1000 mg via INTRAVENOUS

## 2023-05-05 MED ORDER — PHENYLEPHRINE 80 MCG/ML (10ML) SYRINGE FOR IV PUSH (FOR BLOOD PRESSURE SUPPORT)
PREFILLED_SYRINGE | INTRAVENOUS | Status: AC
  Filled 2023-05-05: qty 10

## 2023-05-05 MED ORDER — OXYCODONE HCL 5 MG/5ML PO SOLN: 5.0000 mg | Freq: Once | ORAL | Status: DC | PRN

## 2023-05-05 MED ORDER — ONDANSETRON HCL 4 MG/2ML IJ SOLN
INTRAMUSCULAR | Status: DC | PRN
  Administered 2023-05-05: 4 mg via INTRAVENOUS

## 2023-05-05 MED ORDER — BUPIVACAINE HCL (PF) 0.5 % IJ SOLN
INTRAMUSCULAR | Status: DC | PRN
  Administered 2023-05-05 (×2): 10 mL

## 2023-05-05 MED ORDER — 0.9 % SODIUM CHLORIDE (POUR BTL) OPTIME
TOPICAL | Status: DC | PRN
  Administered 2023-05-05: 500 mL

## 2023-05-05 MED ORDER — FENTANYL CITRATE (PF) 100 MCG/2ML IJ SOLN
INTRAMUSCULAR | Status: AC
  Filled 2023-05-05: qty 2

## 2023-05-05 MED ORDER — POLYETHYLENE GLYCOL 3350 17 G PO PACK
17.0000 g | PACK | Freq: Two times a day (BID) | ORAL | Status: DC
  Administered 2023-05-05: 17 g via ORAL
  Filled 2023-05-05 (×2): qty 1

## 2023-05-05 MED ORDER — PROPOFOL 10 MG/ML IV BOLUS
INTRAVENOUS | Status: AC
  Filled 2023-05-05: qty 20

## 2023-05-05 MED ORDER — DEXAMETHASONE SODIUM PHOSPHATE 10 MG/ML IJ SOLN
INTRAMUSCULAR | Status: DC | PRN
  Administered 2023-05-05: 5 mg via INTRAVENOUS

## 2023-05-05 MED ORDER — OXYCODONE HCL 5 MG PO TABS: 5.0000 mg | ORAL_TABLET | Freq: Once | ORAL | Status: DC | PRN

## 2023-05-05 MED ORDER — BUPIVACAINE HCL (PF) 0.5 % IJ SOLN
INTRAMUSCULAR | Status: AC
  Filled 2023-05-05: qty 20

## 2023-05-05 MED ORDER — BUPIVACAINE LIPOSOME 1.3 % IJ SUSP
INTRAMUSCULAR | Status: AC
Start: 2023-05-05 — End: ?
  Filled 2023-05-05: qty 20

## 2023-05-05 MED ORDER — PROPOFOL 10 MG/ML IV BOLUS
INTRAVENOUS | Status: DC | PRN
  Administered 2023-05-05: 150 mg via INTRAVENOUS

## 2023-05-05 MED ORDER — LIDOCAINE HCL (CARDIAC) PF 100 MG/5ML IV SOSY
PREFILLED_SYRINGE | INTRAVENOUS | Status: DC | PRN
  Administered 2023-05-05: 80 mg via INTRAVENOUS

## 2023-05-05 MED ORDER — FENTANYL CITRATE (PF) 100 MCG/2ML IJ SOLN: 25.0000 ug | INTRAMUSCULAR | Status: DC | PRN

## 2023-05-05 MED ORDER — CEFAZOLIN SODIUM-DEXTROSE 2-4 GM/100ML-% IV SOLN
INTRAVENOUS | Status: AC
  Filled 2023-05-05: qty 100

## 2023-05-05 MED ORDER — FENTANYL CITRATE PF 50 MCG/ML IJ SOSY: 50.0000 ug | PREFILLED_SYRINGE | Freq: Once | INTRAMUSCULAR | Status: DC

## 2023-05-05 MED ORDER — MIDAZOLAM HCL 2 MG/2ML IJ SOLN: 2.0000 mg | Freq: Once | INTRAMUSCULAR | Status: DC

## 2023-05-05 SURGICAL SUPPLY — 52 items
ANCH SUT GRAPPLER 1.4 STRL (Anchor) IMPLANT
BENZOIN TINCTURE PRP APPL 2/3 (GAUZE/BANDAGES/DRESSINGS) IMPLANT
BIT DRILL 2.4X140 LONG SOLID (BIT) IMPLANT
BIT DRILL SOLID 2.0 X 110MM (DRILL) IMPLANT
BLADE SURG 15 STRL LF DISP TIS (BLADE) IMPLANT
BNDG COHESIVE 4X5 TAN STRL LF (GAUZE/BANDAGES/DRESSINGS) ×1 IMPLANT
BNDG ELASTIC 4X5.8 VLCR NS LF (GAUZE/BANDAGES/DRESSINGS) ×1 IMPLANT
BNDG ESMARCH 4X12 STRL LF (GAUZE/BANDAGES/DRESSINGS) ×1 IMPLANT
BNDG GAUZE DERMACEA FLUFF 4 (GAUZE/BANDAGES/DRESSINGS) ×1 IMPLANT
BNDG STRETCH GAUZE 3IN X12FT (GAUZE/BANDAGES/DRESSINGS) ×1 IMPLANT
CUFF TOURN SGL QUICK 18X4 (TOURNIQUET CUFF) IMPLANT
CUFF TRNQT CYL 24X4X16.5-23 (TOURNIQUET CUFF) IMPLANT
DRAPE C-ARM XRAY 36X54 (DRAPES) ×1 IMPLANT
DRAPE C-ARMOR (DRAPES) ×1 IMPLANT
DURAPREP 26ML APPLICATOR (WOUND CARE) ×1 IMPLANT
ELECTRODE REM PT RTRN 9FT ADLT (ELECTROSURGICAL) ×1 IMPLANT
GAUZE SPONGE 4X4 12PLY STRL (GAUZE/BANDAGES/DRESSINGS) ×1 IMPLANT
GAUZE STRETCH 2X75IN STRL (MISCELLANEOUS) ×1 IMPLANT
GAUZE XEROFORM 1X8 LF (GAUZE/BANDAGES/DRESSINGS) ×1 IMPLANT
GLOVE BIO SURGEON STRL SZ7 (GLOVE) ×1 IMPLANT
GLOVE INDICATOR 7.0 STRL GRN (GLOVE) ×1 IMPLANT
GOWN STRL REUS W/ TWL LRG LVL3 (GOWN DISPOSABLE) ×3 IMPLANT
KIT TURNOVER KIT A (KITS) ×1 IMPLANT
LABEL OR SOLS (LABEL) ×1 IMPLANT
MANIFOLD NEPTUNE II (INSTRUMENTS) ×1 IMPLANT
NDL HYPO 22X1.5 SAFETY MO (MISCELLANEOUS) ×1 IMPLANT
NEEDLE HYPO 22X1.5 SAFETY MO (MISCELLANEOUS) ×1 IMPLANT
NS IRRIG 500ML POUR BTL (IV SOLUTION) ×1 IMPLANT
PACK EXTREMITY ARMC (MISCELLANEOUS) ×1 IMPLANT
PAD ABD DERMACEA PRESS 5X9 (GAUZE/BANDAGES/DRESSINGS) IMPLANT
PAD PREP OB/GYN DISP 24X41 (PERSONAL CARE ITEMS) ×1 IMPLANT
PLATE FIBULAR CL 11H LT (Plate) IMPLANT
SCREW LOCK PLATE R3 2.7X10 (Screw) IMPLANT
SCREW LOCK PLATE R3 2.7X11 (Screw) IMPLANT
SCREW LOCK PLATE R3 2.7X12 (Screw) IMPLANT
SCREW LOCK PLATE R3 2.7X28 (Screw) IMPLANT
SCREW LOCK PLT 9X2.7X R3CON (Screw) IMPLANT
SPLINT CAST 1 STEP 4X30 (MISCELLANEOUS) ×1 IMPLANT
SPLINT PLASTER CAST FAST 5X30 (CAST SUPPLIES) ×1 IMPLANT
SPONGE T-LAP 18X18 ~~LOC~~+RFID (SPONGE) ×1 IMPLANT
STAPLER SKIN PROX 35W (STAPLE) ×1 IMPLANT
STOCKINETTE M/LG 89821 (MISCELLANEOUS) ×1 IMPLANT
STRAP SAFETY 5IN WIDE (MISCELLANEOUS) ×1 IMPLANT
STRIP CLOSURE SKIN 1/2X4 (GAUZE/BANDAGES/DRESSINGS) IMPLANT
SUT VIC AB 2-0 CT1 TAPERPNT 27 (SUTURE) ×1 IMPLANT
SUT VIC AB 3-0 SH 27X BRD (SUTURE) ×1 IMPLANT
SYR 10ML LL (SYRINGE) ×1 IMPLANT
SYR 50ML LL SCALE MARK (SYRINGE) ×1 IMPLANT
TRAP FLUID SMOKE EVACUATOR (MISCELLANEOUS) ×1 IMPLANT
WATER STERILE IRR 1000ML POUR (IV SOLUTION) IMPLANT
WATER STERILE IRR 500ML POUR (IV SOLUTION) ×1 IMPLANT
WIRE OLIVE SMOOTH 1.4MMX60MM (WIRE) IMPLANT

## 2023-05-05 NOTE — Progress Notes (Signed)
 PROGRESS NOTE  Tom Edwards    DOB: 1960/01/28, 63 y.o.  UXL:[id number]    Code Status: Full Code   DOA: 05/04/2023   LOS: 1   Brief hospital course  Tom Edwards is a 63 y.o. male with medical history significant for Prostate cancer s/p laparoscopic prostatectomy, DM, HTN, HLD and gout being admitted for R ankle large avulsion fracture involving the distal anterior aspect of the fibula along with several other findings. Dislocation reduced under conscious sedation in ED. Podiatry performed fixation 4/26. Pending PT evaluation   Assessment & Plan  Principal Problem:   Closed left ankle fracture, initial encounter Active Problems:   Type II diabetes mellitus (HCC)   Sleep apnea   Cancer of prostate with intermediate recurrence risk, stage T2B-C or Gleason 7 or prostate-specific antigen (PSA) 10-20 (HCC)  Closed left ankle fracture, initial encounter S/p reduction in the ED Pain control S/p fixation 4/26. PT eval needed prior to dc   Type II diabetes mellitus (HCC) A1c 7.1 on metformin  alone. Can dc insulin    Sleep apnea Does not currently use CPAP   Cancer of prostate with intermediate recurrence risk, stage T2B-C or Gleason 7 or prostate-specific antigen (PSA) 10-20 (HCC) Followed by [facility] No acute issues suspected  Body mass index is 30.13 kg/m.  VTE ppx: SCDs Start: 05/04/23 2011  Diet:     Diet   Diet NPO time specified   Consultants: Podiatry   Subjective 05/05/23    Pt reports moderately well controlled pain while resting. He and wife are looking forward to him going home very soon   Objective   Vitals:   05/04/23 2015 05/04/23 2049 05/05/23 0403 05/05/23 0722  BP: 117/66 124/84 122/86 127/89  Pulse: 78 76 80 80  Resp: 16 15 14 17   Temp: 98.4 F (36.9 C) 98.3 F (36.8 C) 97.7 F (36.5 C) 98.9 F (37.2 C)  TempSrc: Oral  Oral   SpO2: 98% 99% 96% 97%  Weight:  92.5 kg    Height:  5\' 9"  (1.753 m)      Intake/Output Summary (Last 24 hours) at  05/05/2023 0732 Last data filed at 05/05/2023 0403 Gross per 24 hour  Intake 225 ml  Output 1150 ml  Net -925 ml   Filed Weights   05/04/23 1134 05/04/23 2049  Weight: 88.5 kg 92.5 kg     Physical Exam:  General: awake, alert, NAD Respiratory: normal respiratory effort. Cardiovascular: distal perfusion intact Nervous: A&O x3. no gross focal neurologic deficits, normal speech Extremities: L ankle in brace.  Skin: dry, intact, normal temperature, normal color. No rashes, lesions or ulcers on exposed skin Psychiatry: normal mood, congruent affect  Labs   I have personally reviewed the following labs and imaging studies CBC    Component Value Date/Time   WBC 10.4 05/04/2023 1817   RBC 4.11 (L) 05/04/2023 1817   HGB 13.4 05/04/2023 1817   HGB 14.2 05/03/2022 0906   HCT 38.7 (L) 05/04/2023 1817   HCT 42.1 05/03/2022 0906   PLT 220 05/04/2023 1817   PLT 218 05/03/2022 0906   MCV 94.2 05/04/2023 1817   MCV 95 05/03/2022 0906   MCH 32.6 05/04/2023 1817   MCHC 34.6 05/04/2023 1817   RDW 12.9 05/04/2023 1817   RDW 13.6 05/03/2022 0906   LYMPHSABS 1.7 05/04/2023 1817   LYMPHSABS 2.3 05/03/2022 0906   MONOABS 0.7 05/04/2023 1817   EOSABS 0.0 05/04/2023 1817   EOSABS 0.1 05/03/2022 0906   BASOSABS  0.0 05/04/2023 1817   BASOSABS 0.0 05/03/2022 0906      Latest Ref Rng & Units 05/04/2023    6:17 PM 05/03/2022    9:06 AM 02/21/2021    8:11 AM  BMP  Glucose 70 - 99 mg/dL 130  865  784   BUN 8 - 23 mg/dL 15  12  12    Creatinine 0.61 - 1.24 mg/dL 6.96  2.95  2.84   BUN/Creat Ratio 10 - 24  10  10    Sodium 135 - 145 mmol/L 138  140  140   Potassium 3.5 - 5.1 mmol/L 3.6  4.8  4.0   Chloride 98 - 111 mmol/L 102  102  103   CO2 22 - 32 mmol/L 21     Calcium  8.9 - 10.3 mg/dL 8.8  9.2  9.4     DG Ankle 2 Views Left Result Date: 05/04/2023 CLINICAL DATA:  Post reduction EXAM: LEFT ANKLE - 2 VIEW COMPARISON:  Left ankle x-ray 05/04/2023 FINDINGS: Again seen is an acute oblique  fracture through the distal fibula. There is posterior distraction of the distal fracture fragment 1 cm, minimally improved from prior. There is minimal widening of the medial talotibial joint space which has also improved. IMPRESSION: Minimally improved alignment of the distal fibular fracture. There is diffuse soft tissue swelling of the ankle. Electronically Signed   By: Tyron Gallon M.D.   On: 05/04/2023 18:30   DG Ankle 2 Views Left Result Date: 05/04/2023 CLINICAL DATA:  Status post reduction distal tibia and fibula fractures and subluxation. EXAM: LEFT ANKLE - 2 VIEW COMPARISON:  Radiographs and CT obtained earlier today. FINDINGS: Interval fiberglass splint without significant change in position and alignment of the previously described distal fibula and tibia fractures with subluxation. IMPRESSION: Interval fiberglass splint without significant change in position and alignment of the previously described distal fibula and tibia fractures with subluxation. Electronically Signed   By: Catherin Closs M.D.   On: 05/04/2023 16:19   CT Ankle Left Wo Contrast Result Date: 05/04/2023 CLINICAL DATA:  Marvell Slider.  Complex ankle fractures. EXAM: CT OF THE LEFT ANKLE WITHOUT CONTRAST TECHNIQUE: Multidetector CT imaging of the left ankle was performed according to the standard protocol. Multiplanar CT image reconstructions were also generated. RADIATION DOSE REDUCTION: This exam was performed according to the departmental dose-optimization program which includes automated exposure control, adjustment of the mA and/or kV according to patient size and/or use of iterative reconstruction technique. COMPARISON:  Radiographs, same date. FINDINGS: Significant lateral subluxation of the talus in relation to the tibia with marked medial mortise widening consistent with medial ligamentous disruption. Long oblique coursing displaced distal fibular fracture at and above the level of the ankle mortise. Maximum displacement is 12 mm.  There is also a large avulsion fracture involving the distal anterior aspect of the fibula. This is displaced approximately 12 mm. The medial malleolus is intact. There is a small impaction type fracture involving the posteroinferior lip of the distal tibia with small bone fragments and minimal disruption of the very posterior articular surface. Evidence of a prior tibiofibular syndesmotic injury with heavy calcifications in the membrane. The talus is intact. Mild subchondral cystic changes are noted. The subtalar joints are maintained in the sinus tarsi is normal. No mid or hindfoot fractures are identified. Extensive subcutaneous soft tissue swelling/edema/hematoma associated with the fractures. IMPRESSION: 1. Significant lateral subluxation of the talus in relation to the tibia with marked medial mortise widening consistent with medial ligamentous disruption. 2.  Long oblique coursing displaced distal fibular fracture at and above the level of the ankle mortise. 3. Large avulsion fracture involving the distal anterior aspect of the fibula. 4. Small impaction type fracture involving the posteroinferior lip of the distal tibia with small bone fragments and minimal disruption of the very posterior articular surface. 5. Evidence of a prior tibiofibular syndesmotic injury with heavy calcifications in the membrane. 6. Extensive subcutaneous soft tissue swelling/edema/hematoma associated with the fractures. Electronically Signed   By: Marrian Siva M.D.   On: 05/04/2023 14:21   DG Ankle Complete Left Result Date: 05/04/2023 CLINICAL DATA:  Left ankle injury after fall. EXAM: LEFT ANKLE COMPLETE - 3+ VIEW COMPARISON:  April 23, 2006. FINDINGS: Moderate lateral talotibial dislocation is noted with severely displaced distal left fibular oblique fracture. IMPRESSION: Severely displaced distal left fibular fracture with moderate lateral talotibial dislocation. Electronically Signed   By: Rosalene Colon M.D.   On:  05/04/2023 12:53    Disposition Plan & Communication  Patient status: Inpatient  Admitted From: Home Planned disposition location: Home Anticipated discharge date: today or tomorrow pending PT  Family Communication: wife at bedside    Author: Ree Candy, DO [facility] 05/05/2023, 7:32 AM   Available by Epic secure chat 7AM-7PM. If 7PM-7AM, please contact night-coverage.  [facility] contact information found on [email or web address].

## 2023-05-05 NOTE — H&P (Signed)
 HISTORY AND PHYSICAL INTERVAL NOTE:  05/05/2023  9:31 AM  Tom Edwards  has presented today for surgery, with the diagnosis of left ankle bimalleolar fracture.  The various methods of treatment have been discussed with the patient.  No guarantees were given.  After consideration of risks, benefits and other options for treatment, the patient has consented to surgery.  I have reviewed the patients' chart and labs.    Left ankle bimalleolar ankle fracture ORIF Left ankle deltoid ligament repair   A history and physical examination was performed in the hospital.  The patient was reexamined.  There have been no changes to this history and physical examination.  [place]ges, DPM

## 2023-05-05 NOTE — Anesthesia Preprocedure Evaluation (Signed)
 Anesthesia Evaluation  Patient identified by MRN, date of birth, ID band Patient awake    Reviewed: Allergy & Precautions, NPO status , Patient's Chart, lab work & pertinent test results  Airway Mallampati: III  TM Distance: >3 FB Neck ROM: full    Dental  (+) Chipped, Dental Advidsory Given   Pulmonary neg pulmonary ROS, Current Smoker   Pulmonary exam normal breath sounds clear to auscultation       Cardiovascular Exercise Tolerance: Good hypertension, Pt. on medications negative cardio ROS Normal cardiovascular exam Rhythm:Regular Rate:Normal     Neuro/Psych negative neurological ROS  negative psych ROS   GI/Hepatic negative GI ROS, Neg liver ROS,,,  Endo/Other  negative endocrine ROSdiabetes, Well Controlled, Type 2    Renal/GU      Musculoskeletal   Abdominal   Peds  Hematology negative hematology ROS (+)   Anesthesia Other Findings Past Medical History: No date: Acute medial meniscus tear No date: DM (diabetes mellitus), type 2 (HCC) No date: Gout No date: Hyperlipemia No date: Hypertension No date: Over weight No date: Sedentary lifestyle No date: Sleep apnea     Comment:  no cpap No date: Vertigo No date: Vertigo  Past Surgical History: No date: COLONOSCOPY 02/11/2021: COLONOSCOPY WITH PROPOFOL ; N/A     Comment:  Procedure: COLONOSCOPY WITH PROPOFOL ;  Surgeon:               Shane Darling, MD;  Location: [facility] ENDOSCOPY;                Service: Endoscopy;  Laterality: N/A; 07/24/2018: INGUINAL HERNIA REPAIR; Left     Comment:  Procedure: HERNIA REPAIR INGUINAL ADULT, LEFT OPEN;                Surgeon: Marshall Skeeter, MD;  Location: [facility] ORS;                Service: General;  Laterality: Left;  BMI    Body Mass Index: 30.13 kg/m      Reproductive/Obstetrics negative OB ROS                             Anesthesia Physical Anesthesia Plan  ASA:  2  Anesthesia Plan: General   Post-op Pain Management: Regional block*   Induction: Intravenous  PONV Risk Score and Plan: Dexamethasone , Ondansetron , Midazolam  and Treatment may vary due to age or medical condition  Airway Management Planned: LMA  Additional Equipment:   Intra-op Plan:   Post-operative Plan: Extubation in OR  Informed Consent: I have reviewed the patients History and Physical, chart, labs and discussed the procedure including the risks, benefits and alternatives for the proposed anesthesia with the patient or authorized representative who has indicated his/her understanding and acceptance.     Dental Advisory Given  Plan Discussed with: Anesthesiologist, CRNA and Surgeon  Anesthesia Plan Comments: (Patient consented for risks of anesthesia including but not limited to:  - adverse reactions to medications - damage to eyes, teeth, lips or other oral mucosa - nerve damage due to positioning  - sore throat or hoarseness - Damage to heart, brain, nerves, lungs, other parts of body or loss of life  Patient voiced understanding and assent.)       Anesthesia Quick Evaluation

## 2023-05-05 NOTE — Anesthesia Procedure Notes (Signed)
 Procedure Name: LMA Insertion Date/Time: 05/05/2023 1:19 PM  Performed by: Delice Felt, CRNAPre-anesthesia Checklist: Patient identified, Patient being monitored, Timeout performed, Emergency Drugs available and Suction available Patient Re-evaluated:Patient Re-evaluated prior to induction Oxygen Delivery Method: Circle system utilized Preoxygenation: Pre-oxygenation with 100% oxygen Induction Type: IV induction Ventilation: Mask ventilation without difficulty LMA: LMA inserted and LMA with gastric port inserted LMA Size: 5.0 Tube type: Oral Number of attempts: 1 Placement Confirmation: positive ETCO2 and breath sounds checked- equal and bilateral Tube secured with: Tape Dental Injury: Teeth and Oropharynx as per pre-operative assessment

## 2023-05-05 NOTE — Discharge Instructions (Signed)
 [facility]  POST OPERATIVE INSTRUCTIONS FOR DR. Althea Atkinson AND DR. Emberlin Verner [facility] PODIATRY DEPARTMENT   Take your medication as prescribed.  Pain medication should be taken only as needed.  Maximum dose of Tylenol  per day is 4000 mg.  Keep the dressing clean, dry and intact.  Remain nonweightbearing at all times left lower extremity using crutches, knee scooter, or wheelchair.  Keep your foot elevated above the heart level for the first 48 hours.  Continue elevation thereafter to improve swelling, may also apply ice to the top of the left ankle or back of left knee for maximum 10 minutes out of every 1 hour as needed  Walking to the bathroom and brief periods of walking are acceptable, unless we have instructed you to be non-weight bearing.  Always wear your post-op shoe when walking.  Always use your crutches if you are to be non-weight bearing.  Do not take a shower. Baths are permissible as long as the foot is kept out of the water.   Every hour you are awake:  Bend your knee 15 times. Massage calf 15 times  Call [facility] [phone]) if any of the following problems occur: You develop a temperature or fever. The bandage becomes saturated with blood. Medication does not stop your pain. Injury of the foot occurs. Any symptoms of infection including redness, odor, or red streaks running from wound.

## 2023-05-05 NOTE — Anesthesia Postprocedure Evaluation (Signed)
 Anesthesia Post Note  Patient: Tom Edwards  Procedure(s) Performed: OPEN REDUCTION INTERNAL FIXATION (ORIF) ANKLE FRACTURE, DELTOID LIGAMENT REPAIR (Left: Ankle)  Patient location during evaluation: PACU Anesthesia Type: General Level of consciousness: awake and alert Pain management: pain level controlled Vital Signs Assessment: post-procedure vital signs reviewed and stable Respiratory status: spontaneous breathing, nonlabored ventilation, respiratory function stable and patient connected to nasal cannula oxygen Cardiovascular status: blood pressure returned to baseline and stable Postop Assessment: no apparent nausea or vomiting Anesthetic complications: no  No notable events documented.   Last Vitals:  Vitals:   05/05/23 1653 05/05/23 2059  BP: 115/81 (!) 141/82  Pulse: 78 88  Resp: 16 17  Temp: 37 C 36.9 C  SpO2: 95% 100%    Last Pain:  Vitals:   05/05/23 1558  TempSrc:   PainSc: 0-No pain                 Enrique Harvest

## 2023-05-05 NOTE — Anesthesia Procedure Notes (Signed)
 Anesthesia Regional Block: Popliteal block   Pre-Anesthetic Checklist: , timeout performed,  Correct Patient, Correct Site, Correct Laterality,  Correct Procedure, Correct Position, site marked,  Risks and benefits discussed,  Surgical consent,  Pre-op evaluation,  At surgeon's request and post-op pain management  Laterality: Lower and Left  Prep: chloraprep       Needles:  Injection technique: Single-shot  Needle Type: Echogenic Needle     Needle Length: 9cm  Needle Gauge: 21     Additional Needles:   Procedures:,,,, ultrasound used (permanent image in chart),,    Narrative:  Start time: 05/05/2023 12:20 PM End time: 05/05/2023 12:21 PM Injection made incrementally with aspirations every 5 mL.  Performed by: Personally  Anesthesiologist: Enrique Harvest, MD  Additional Notes: Patient's chart reviewed and they were deemed appropriate candidate for procedure, at surgeon's request. Patient educated about risks, benefits, and alternatives of the block including but not limited to: temporary or permanent nerve damage, bleeding, infection, damage to surround tissues, block failure, local anesthetic toxicity. Patient expressed understanding. A formal time-out was conducted consistent with institution rules.  Monitors were applied, and minimal sedation used. The site was prepped with skin prep and allowed to dry, and sterile gloves were used. A high frequency linear ultrasound probe with probe cover was utilized throughout. Popliteal artery pulsatile and visualized in popliteal fossa along with adjacent sciatic nerve and its branch point, which appeared anatomically normal, local anesthetic injected around them just proximal to the branch point, and echogenic block needle trajectory was monitored throughout. Aspiration performed every 5ml. Blood vessels were avoided. All injections were performed without resistance and free of blood and paresthesias. The patient tolerated the procedure well.  A picture of the nerve block was added to the patient's chart.  Injectate: 10ml of exparel  and 10 ml of 0.5% bupivacaine 

## 2023-05-05 NOTE — Transfer of Care (Signed)
 Immediate Anesthesia Transfer of Care Note  Patient: Tom Edwards  Procedure(s) Performed: OPEN REDUCTION INTERNAL FIXATION (ORIF) ANKLE FRACTURE, DELTOID LIGAMENT REPAIR (Left: Ankle)  Patient Location: PACU  Anesthesia Type:General  Level of Consciousness: drowsy  Airway & Oxygen Therapy: Patient Spontanous Breathing and Patient connected to face mask oxygen  Post-op Assessment: Report given to RN and Post -op Vital signs reviewed and stable  Post vital signs: Reviewed and stable  Last Vitals:  Vitals Value Taken Time  BP 119/73 05/05/23 1523  Temp    Pulse 73 05/05/23 1525  Resp 10 05/05/23 1525  SpO2 100 % 05/05/23 1525  Vitals shown include unfiled device data.  Last Pain:  Vitals:   05/05/23 0403  TempSrc: Oral  PainSc:          Complications: No notable events documented.

## 2023-05-05 NOTE — Consult Note (Signed)
 PODIATRY / FOOT AND ANKLE SURGERY CONSULTATION NOTE  Requesting Physician: Dr. Hendrick Locke  Reason for consult: left ankle fracture  Chief Complaint: left ankle pain   HPI: Tom Edwards is a 63 y.o. male who presents with a left ankle injury after slipping while playing golf yesterday morning.  Patient went to [facility] in the early afternoon and was noted to have a bimalleolar displaced ankle fracture.  They tried to do some close reduction with the patient awake but failed.  Subsequently they did sedate him and then performed more thorough close reduction was more successful but still the ankle remains somewhat displaced.  Patient presents today resting in bed comfortably but still having some pain to the left ankle.  Splint appears to be intact.  PMHx:  Past Medical History:  Diagnosis Date   Acute medial meniscus tear    DM (diabetes mellitus), type 2 (HCC)    Gout    Hyperlipemia    Hypertension    Over weight    Sedentary lifestyle    Sleep apnea    no cpap   Vertigo    Vertigo     Surgical Hx:  Past Surgical History:  Procedure Laterality Date   COLONOSCOPY     COLONOSCOPY WITH PROPOFOL  N/A 02/11/2021   Procedure: COLONOSCOPY WITH PROPOFOL ;  Surgeon: Shane Darling, MD;  Location: [facility] ENDOSCOPY;  Service: Endoscopy;  Laterality: N/A;   INGUINAL HERNIA REPAIR Left 07/24/2018   Procedure: HERNIA REPAIR INGUINAL ADULT, LEFT OPEN;  Surgeon: Marshall Skeeter, MD;  Location: [facility] ORS;  Service: General;  Laterality: Left;    FHx:  Family History  Problem Relation Age of Onset   Diabetes Mother    Cerebrovascular Accident Mother    Prostate cancer Father    Hypertension Father    Diabetes Brother     Social History:  reports that he has been smoking cigars. He has been exposed to tobacco smoke. He has never used smokeless tobacco. He reports current alcohol use of about 10.0 standard drinks of alcohol per week. He reports that he does not use drugs.  Allergies: No Known  Allergies  Medications Prior to Admission  Medication Sig Dispense Refill   allopurinol  (ZYLOPRIM ) 300 MG tablet Take 1 tablet (300 mg total) by mouth daily. 90 tablet 2   amLODipine  (NORVASC ) 10 MG tablet Take 1 tablet (10 mg total) by mouth daily. 90 tablet 3   bisoprolol  (ZEBETA ) 10 MG tablet Take 1 tablet (10 mg total) by mouth daily. 90 tablet 3   metFORMIN  (GLUCOPHAGE ) 500 MG tablet Take 1 tablet (500 mg total) by mouth 2 (two) times daily with a meal. 180 tablet 3   sildenafil (VIAGRA) 25 MG tablet Take 25 mg by mouth daily as needed for erectile dysfunction. Prostate removed      Physical Exam: General: Alert and oriented.  No apparent distress.  Splint intact to the left lower extremity  Capillary fill time intact to digits of left foot, hair growth present to digits, patient able to dorsiflex and plantarflex digits and light touch sensation appears to be intact to digits.  Unable to visualize skin due to splint placement.  Results for orders placed or performed during the hospital encounter of 05/04/23 (from the past 48 hours)  CBC with Differential     Status: Abnormal   Collection Time: 05/04/23  6:17 PM  Result Value Ref Range   WBC 10.4 4.0 - 10.5 K/uL   RBC 4.11 (L) 4.22 - 5.81  MIL/uL   Hemoglobin 13.4 13.0 - 17.0 g/dL   HCT 86.5 (L) 78.4 - 69.6 %   MCV 94.2 80.0 - 100.0 fL   MCH 32.6 26.0 - 34.0 pg   MCHC 34.6 30.0 - 36.0 g/dL   RDW 29.5 28.4 - 13.2 %   Platelets 220 150 - 400 K/uL   nRBC 0.0 0.0 - 0.2 %   Neutrophils Relative % 78 %   Neutro Abs 8.0 (H) 1.7 - 7.7 K/uL   Lymphocytes Relative 16 %   Lymphs Abs 1.7 0.7 - 4.0 K/uL   Monocytes Relative 6 %   Monocytes Absolute 0.7 0.1 - 1.0 K/uL   Eosinophils Relative 0 %   Eosinophils Absolute 0.0 0.0 - 0.5 K/uL   Basophils Relative 0 %   Basophils Absolute 0.0 0.0 - 0.1 K/uL   Immature Granulocytes 0 %   Abs Immature Granulocytes 0.04 0.00 - 0.07 K/uL    Comment: Performed at [facility], [address], [place], [place] [address]  Basic metabolic panel     Status: Abnormal   Collection Time: 05/04/23  6:17 PM  Result Value Ref Range   Sodium 138 135 - 145 mmol/L   Potassium 3.6 3.5 - 5.1 mmol/L   Chloride 102 98 - 111 mmol/L   CO2 21 (L) 22 - 32 mmol/L   Glucose, Bld 208 (H) 70 - 99 mg/dL    Comment: Glucose reference range applies only to samples taken after fasting for at least 8 hours.   BUN 15 8 - 23 mg/dL   Creatinine, Ser 2.72 0.61 - 1.24 mg/dL   Calcium  8.8 (L) 8.9 - 10.3 mg/dL   GFR, Estimated >53 >66 mL/min    Comment: (NOTE) Calculated using the CKD-EPI Creatinine Equation (2021)    Anion gap 15 5 - 15    Comment: Performed at [facility], [address] [place], [place] [address]  Hemoglobin A1c     Status: Abnormal   Collection Time: 05/04/23  8:10 PM  Result Value Ref Range   Hgb A1c MFr Bld 7.1 (H) 4.8 - 5.6 %    Comment: (NOTE) Pre diabetes:          5.7%-6.4%  Diabetes:              >6.4%  Glycemic control for   <7.0% adults with diabetes    Mean Plasma Glucose 157.07 mg/dL    Comment: Performed at [facility], [address] [place], [place] [address]  Glucose, capillary     Status: Abnormal   Collection Time: 05/04/23  9:22 PM  Result Value Ref Range   Glucose-Capillary 217 (H) 70 - 99 mg/dL    Comment: Glucose reference range applies only to samples taken after fasting for at least 8 hours.  Glucose, capillary     Status: Abnormal   Collection Time: 05/05/23 12:06 AM  Result Value Ref Range   Glucose-Capillary 220 (H) 70 - 99 mg/dL    Comment: Glucose reference range applies only to samples taken after fasting for at least 8 hours.  Glucose, capillary     Status: Abnormal   Collection Time: 05/05/23  4:02 AM  Result Value Ref Range   Glucose-Capillary 114 (H) 70 - 99 mg/dL    Comment: Glucose reference range applies only to samples taken after fasting for at least 8 hours.  Type and screen [facility]     Status: None   Collection  Time: 05/05/23  4:45 AM  Result Value Ref Range   ABO/RH(D) O POS    Antibody Screen NEG    Sample Expiration      05/08/2023,2359 Performed at [facility], [address] [place], [place] [address]   Glucose, capillary     Status: Abnormal   Collection Time: 05/05/23  7:20 AM  Result Value Ref Range   Glucose-Capillary 262 (H) 70 - 99 mg/dL    Comment: Glucose reference range applies only to samples taken after fasting for at least 8 hours.   DG Ankle 2 Views Left Result Date: 05/04/2023 CLINICAL DATA:  Post reduction EXAM: LEFT ANKLE - 2 VIEW COMPARISON:  Left ankle x-ray 05/04/2023 FINDINGS: Again seen is an acute oblique fracture through the distal fibula. There is posterior distraction of the distal fracture fragment 1 cm, minimally improved from prior. There is minimal widening of the medial talotibial joint space which has also improved. IMPRESSION: Minimally improved alignment of the distal fibular fracture. There is diffuse soft tissue swelling of the ankle. Electronically Signed   By: Tyron Gallon M.D.   On: 05/04/2023 18:30   DG Ankle 2 Views Left Result Date: 05/04/2023 CLINICAL DATA:  Status post reduction distal tibia and fibula fractures and subluxation. EXAM: LEFT ANKLE - 2 VIEW COMPARISON:  Radiographs and CT obtained earlier today. FINDINGS: Interval fiberglass splint without significant change in position and alignment of the previously described distal fibula and tibia fractures with subluxation. IMPRESSION: Interval fiberglass splint without significant change in position and alignment of the previously described distal fibula and tibia fractures with subluxation. Electronically Signed   By: Catherin Closs M.D.   On: 05/04/2023 16:19   CT Ankle Left Wo Contrast Result Date: 05/04/2023 CLINICAL DATA:  Marvell Slider.  Complex ankle fractures. EXAM: CT OF THE LEFT ANKLE WITHOUT CONTRAST TECHNIQUE: Multidetector CT imaging of the left  ankle was performed according to the standard protocol. Multiplanar CT image reconstructions were also generated. RADIATION DOSE REDUCTION: This exam was performed according to the departmental dose-optimization program which includes automated exposure control, adjustment of the mA and/or kV according to patient size and/or use of iterative reconstruction technique. COMPARISON:  Radiographs, same date. FINDINGS: Significant lateral subluxation of the talus in relation to the tibia with marked medial mortise widening consistent with medial ligamentous disruption. Long oblique coursing displaced distal fibular fracture at and above the level of the ankle mortise. Maximum displacement is 12 mm. There is also a large avulsion fracture involving the distal anterior aspect of the fibula. This is displaced approximately 12 mm. The medial malleolus is intact. There is a small impaction type fracture involving the posteroinferior lip of the distal tibia with small bone fragments and minimal disruption of the very posterior articular surface. Evidence of a prior tibiofibular syndesmotic injury with heavy calcifications in the membrane. The talus is intact. Mild subchondral cystic changes are noted. The subtalar joints are maintained in the sinus tarsi is normal. No mid or hindfoot fractures are identified. Extensive subcutaneous soft tissue swelling/edema/hematoma associated with the fractures. IMPRESSION: 1. Significant lateral subluxation of the talus in relation to the tibia with marked medial mortise widening consistent with medial ligamentous disruption. 2. Long oblique coursing displaced distal fibular fracture at and above the level of the ankle mortise. 3. Large avulsion fracture involving the distal anterior aspect of the fibula. 4. Small impaction type fracture involving the posteroinferior lip of the distal tibia with small bone fragments and minimal disruption of the very  posterior articular surface. 5. Evidence  of a prior tibiofibular syndesmotic injury with heavy calcifications in the membrane. 6. Extensive subcutaneous soft tissue swelling/edema/hematoma associated with the fractures. Electronically Signed   By: Marrian Siva M.D.   On: 05/04/2023 14:21   DG Ankle Complete Left Result Date: 05/04/2023 CLINICAL DATA:  Left ankle injury after fall. EXAM: LEFT ANKLE COMPLETE - 3+ VIEW COMPARISON:  April 23, 2006. FINDINGS: Moderate lateral talotibial dislocation is noted with severely displaced distal left fibular oblique fracture. IMPRESSION: Severely displaced distal left fibular fracture with moderate lateral talotibial dislocation. Electronically Signed   By: Rosalene Colon M.D.   On: 05/04/2023 12:53    Blood pressure 127/89, pulse 80, temperature 98.9 F (37.2 C), resp. rate 17, height 5\' 9"  (1.753 m), weight 92.5 kg, SpO2 97%.  Assessment Left closed displaced bimalleolar ankle fracture Diabetes type 2  Plan - Patient seen and examined - X-ray imaging and CT imaging reviewed and discussed with patient in detail. - Discussed conservative and surgical attempts at correction. - All treatment options were discussed with the patient of both conservative and surgical attempts at correction including potential risks and complications.  Patient has elected for procedure consisting of left ankle fracture open reduction with internal fixation with deltoid ligament repair.  No guarantees given.  Consent obtained.  Patient has risk for posttraumatic arthritis that could result in chronic ankle pain. - Discussed postoperative course, patient allowed to be nonweightbearing for 6 weeks after surgery and then be placed into a cam boot to allow partial weightbearing for a month along with physical therapy, then patient will go into an ASO ankle brace back inside normal supportive shoe.  Discussed with patient it could take 4 to 6 months to fully recover from this injury but once again patient is at risk for  developing posttraumatic arthritis and chronic pain in the ankle. - Procedure to be performed today at around 12 PM, patient currently n.p.o. for surgery.   Pink Bridges, DPM 05/05/2023, 7:26 AM

## 2023-05-05 NOTE — Anesthesia Procedure Notes (Signed)
 Anesthesia Regional Block: Adductor canal block   Pre-Anesthetic Checklist: , timeout performed,  Correct Patient, Correct Site, Correct Laterality,  Correct Procedure, Correct Position, site marked,  Risks and benefits discussed,  Surgical consent,  Pre-op evaluation,  At surgeon's request and post-op pain management  Laterality: Lower and Left  Prep: chloraprep       Needles:  Injection technique: Single-shot  Needle Type: Echogenic Needle     Needle Length: 9cm  Needle Gauge: 21     Additional Needles:   Procedures:,,,, ultrasound used (permanent image in chart),,    Narrative:  Start time: 05/05/2023 12:19 PM End time: 05/05/2023 12:20 PM Injection made incrementally with aspirations every 5 mL.  Performed by: Personally  Anesthesiologist: Enrique Harvest, MD  Additional Notes: Patient's chart reviewed and they were deemed appropriate candidate for procedure, per surgeon's request. Patient educated about risks, benefits, and alternatives of the block including but not limited to: temporary or permanent nerve damage, bleeding, infection, damage to surround tissues, block failure, local anesthetic toxicity. Patient expressed understanding. A formal time-out was conducted consistent with institution rules.  Monitors were applied, and minimal sedation used (see nursing record). The site was prepped with skin prep and allowed to dry, and sterile gloves were used. A high frequency linear ultrasound probe with probe cover was utilized throughout. Femoral artery visualized at mid-thigh level, local anesthetic injected anterolateral to it, and echogenic block needle trajectory was monitored throughout. Hydrodissection of saphenous nerve visualized and appeared anatomically normal. Aspiration performed every 5ml. Blood vessels were avoided. All injections were performed without resistance and free of blood and paresthesias. The patient tolerated the procedure well. A picture of the nerve  block was added to the patient's chart.  Injectate: 10ml of exparel  and 10ml of 0.5% bupivacaine 

## 2023-05-05 NOTE — Plan of Care (Signed)
  Problem: Nutritional: Goal: Maintenance of adequate nutrition will improve Outcome: Progressing   Problem: Education: Goal: Knowledge of General Education information will improve Description: Including pain rating scale, medication(s)/side effects and non-pharmacologic comfort measures Outcome: Progressing   Problem: Health Behavior/Discharge Planning: Goal: Ability to manage health-related needs will improve Outcome: Progressing   Problem: Activity: Goal: Risk for activity intolerance will decrease Outcome: Progressing

## 2023-05-05 NOTE — Op Note (Signed)
 PODIATRY / FOOT AND ANKLE SURGERY OPERATIVE REPORT    SURGEON: Pink Bridges, DPM  PRE-OPERATIVE DIAGNOSIS:  1.  Left bimalleolar ankle fracture 2.  Left deltoid ligament rupture  POST-OPERATIVE DIAGNOSIS: Same  PROCEDURE(S): Left bimalleolar ankle fracture open reduction with internal fixation Left deltoid ligament repair  HEMOSTASIS: Left thigh tourniquet  ANESTHESIA: general  ESTIMATED BLOOD LOSS: 40 cc  FINDING(S): 1.  Comminuted left fibular fracture 2.  Avulsion fracture medial malleolus with rupture of deltoid ligament  PATHOLOGY/SPECIMEN(S): None  INDICATIONS:   Tom Edwards is a 63 y.o. male who presents with a left ankle injury after sustaining a fall while golfing yesterday patient had closed reduction performed in the emergency room on arrival which improved the position of the ankle but still remained somewhat mall reduced.  Patient was seen and x-rays were discussed discussing surgical plan for fixation.  All treatment options were discussed with the patient of both conservative and surgical attempts at correction including potential risks and complications.  Patient has elected for procedure consisting of left ankle fracture open reduction with internal fixation with deltoid ligament repair.  No guarantees given.  Consent obtained.  Discussed risks of postop arthritis.  Patient is also high risk due to being diabetic as well as has a history of smoking.  Recommend smoking cessation immediately to improve odds of healing and control blood glucose. .  DESCRIPTION: After obtaining full informed written consent, the patient was brought back to the operating room and placed supine upon the operating table.  The patient received IV antibiotics prior to induction.  After obtaining adequate anesthesia, the patient was prepped and draped in the standard fashion.  A popliteal and saphenous nerve block was performed by anesthesia preoperatively to the left lower  extremity.  The left lower extremity was exsanguinated with Esmarch bandage and the pneumatic thigh tourniquet was inflated.  Attention was directed to the left lateral ankle where an incision was made over the distal tip of the lateral malleolus extending to the distal shaft of the fibula.  The incision was deepened to the subcutaneous tissues utilizing sharp and blunt dissection care was taken to identify and retract all vital neural and vascular structures and all venous contributories were cauterized as necessary.  At this time the fracture was easily able to be visualized and the periosteum appeared to be disrupted over this area.  The fibular fracture appeared to be laterally and proximally displaced.  Some of the periosteum was removed from the fracture margin and elevated to thereby expose the bone yet further at the operative site.  The fracture was then cleaned of the hematoma that was present.  There appeared to be a large anterior fragment present at the distal aspect of the fibula that appeared to be adhered to the syndesmosis and slightly mobile.  At this time a reduction clamp was applied to the distal fibula or lateral malleolus area and was pulled out to length and reduced in the appropriate position rotating the fibula back in place.  The posterior fragment was keyed into place indicating successful reduction.  The reduction clamp was used to compress the area once appeared to be clinically reduced.  C-arm imaging was utilized to verify reduction which appeared to be excellent overall in the AP and lateral views.  At this time a Paragon 28 11 hole locking plate was applied and temporary fixated with all of our fixation.  C-arm imaging was utilized to verify correct position of plate and reduction which appeared  to be excellent.  4 locking screws then placed proximal to the fracture that were all 2.7 mm in length utilizing standard AO principles and techniques.  5 locking screws were then placed  distal to the fracture site in the lateral malleolus that were all 2.7 mm in length utilizing standard AO principles and techniques.  The reduction clamp was removed and appeared to have good fixation across the area and the fracture fragment appeared to be well opposed overall with good compression.  At this time an additional 2.7 mm x 28 mm fully threaded screw was placed through the anterior bone fragment of the fibula going across the lateral malleolus utilizing standard AO principles and techniques.  Excellent compression appeared to be across this area.  C-arm imaging was utilized to verify correction which appeared to be excellent overall.  The fibula appeared to be out to length and rotated in the appropriate position.  The ankle appeared to be in near anatomic alignment overall.  The screws and plate appeared to be of the appropriate size and length.  Valgus stress testing was then performed which did show valgus instability indicating deltoid rupture.  The cotton hook test was performed and there did not appear to be any syndesmotic instability.  Attention was then directed to the left medial ankle over the medial malleolus and distal tibia where an incision was made slightly medial to the great saphenous vein along this course.  The incision was deepened to the subcutaneous tissues utilizing sharp and blunt dissection and care was taken to identify retract all vital neural and vascular structures and all venous contributories were cauterized necessary.  At this time the great saphenous vein was retracted out of the way through the out the remainder of the case in this area.  The ankle joint was easily able to be visualized as there was a complete rupture of the deltoid ligament.  The deltoid ligament appeared to be very frayed and the posterior tibial tendon was also able to be visualized.  The posterior tibial tendon appeared to be in normal placement overall and did not appear to be displaced.  The  periosteum was slightly raised off of the medial malleolus to visualize a portion of the bone.  At this time a Paragon 28 grapple or suture anchor was placed utilizing standard techniques.  This appeared to have excellent seating.  The grapple or suture was then placed through the remnant deltoid and a few passes.  The foot was then held in an inverted position and the deltoid ligament was then reapposed to the medial malleolus.  The deltoid ligament was sutured in a pants over vest type stitch with additional 3-0 Vicryl to reinforce the repair.  Valgus stress testing was then performed once again under fluoroscopic guidance and appeared to have good reduction in the deltoid ligament rupture and the ankle appeared to be much more stable.  All surgical sites were flushed with copious amounts of normal sterile saline.  The periosteum/capsular structures were reapproximated well coapted with 3-0 Vicryl.  The subcutaneous tissue was reapproximated well coapted 3-0 Vicryl and the skin was then reapproximated well coapted with skin stapler.  The pneumatic thigh tourniquet was deflated and a prompt hyperemic response was noted all digits left foot.  The postoperative dressing was applied consisting of Xeroform followed by 4 x 4 gauze, ABD pads, Kerlix, Webril, posterior splint, Ace wrap.  The patient tolerated the procedure and anesthesia well and was transferred to recovery in [employer] stable vascular  status intact all toes left foot.  Following a period of postoperative monitoring the patient be discharged back to the inpatient room with the appropriate orders, instructions, medications.  Patient is to work with physical therapy and then could be considered for discharge at some point within the next 24 hours.  Patient will have to be nonweightbearing for 6 weeks and will need likely crutches or knee scooter after the procedure.  Recommend elevation as much as possible as well as ice to the anterior ankle or posterior  left knee as needed.  Recommend discharging on Percocet 5/325 1 p.o. every 6 hours as needed pain, 28 tablets, Zofran  if needed, Augmentin 7-day course, and aspirin 81 mg twice daily starting tomorrow.  If patient is discharged patient is to follow-up in outpatient clinic within the next 1 to 2 weeks for dressing change and evaluation of incision and healing.  COMPLICATIONS: None  CONDITION: Good, stable  Pink Bridges, DPM

## 2023-05-06 DIAGNOSIS — S82892A Other fracture of left lower leg, initial encounter for closed fracture: Secondary | ICD-10-CM | POA: Diagnosis not present

## 2023-05-06 LAB — GLUCOSE, CAPILLARY: Glucose-Capillary: 136 mg/dL — ABNORMAL HIGH (ref 70–99)

## 2023-05-06 MED ORDER — AMOXICILLIN-POT CLAVULANATE 875-125 MG PO TABS: 1.0000 | ORAL_TABLET | Freq: Two times a day (BID) | ORAL | 0 refills | Status: AC

## 2023-05-06 MED ORDER — ASPIRIN 81 MG PO TBEC: 81.0000 mg | DELAYED_RELEASE_TABLET | Freq: Two times a day (BID) | ORAL | 0 refills | Status: AC

## 2023-05-06 MED ORDER — ACETAMINOPHEN 325 MG PO TABS: 650.0000 mg | ORAL_TABLET | Freq: Four times a day (QID) | ORAL | 0 refills | Status: AC | PRN

## 2023-05-06 MED ORDER — ORAL CARE MOUTH RINSE: 15.0000 mL | OROMUCOSAL | Status: DC | PRN

## 2023-05-06 MED ORDER — OXYCODONE HCL 5 MG PO TABS
5.0000 mg | ORAL_TABLET | Freq: Four times a day (QID) | ORAL | 0 refills | Status: AC | PRN
Start: 2023-05-06 — End: 2023-05-09

## 2023-05-06 NOTE — Evaluation (Signed)
 Physical Therapy Evaluation Patient Details Name: Tom Edwards MRN: [id number] DOB: [birth year] Today's Date: 05/06/2023  History of Present Illness  Pt is a 63 y/o M admitted on 05/04/23 after presenting with c/o a fall while playing golf. Pt found to have L ankle bimalleolar fx. Pt underwent ORIF & deltoid ligament repair. PMH: HTN, HLD, DM2, prostate CA s/p laparoscopic prostatectomy, gout, vertigo  Clinical Impression  Pt seen for PT evaluation with pt agreeable. Pt reports prior to admission he was very active, working, enjoys golf. On this date, pt demonstrates very good understanding of situation & NWB LLE. Pt eager to use knee scooter at d/c. Pt is able to ambulate around unit with knee scooter with mod I, negotiates stairs with B rails & CGA<>supervision (CGA only to keep pants from falling). Pt demonstrates good safety awareness & mobility.         If plan is discharge home, recommend the following: Assistance with cooking/housework;Assist for transportation;Help with stairs or ramp for entrance   Can travel by private vehicle        Equipment Recommendations Other (comment) (knee scooter)  Recommendations for Other Services       Functional Status Assessment Patient has had a recent decline in their functional status and demonstrates the ability to make significant improvements in function in a reasonable and predictable amount of time.     Precautions / Restrictions Precautions Precautions: Fall Restrictions Weight Bearing Restrictions Per Provider Order: Yes LLE Weight Bearing Per Provider Order: Non weight bearing      Mobility  Bed Mobility               General bed mobility comments: not tested, pt received & left sitting in recliner    Transfers Overall transfer level: Needs assistance Equipment used: None (knee scooter) Transfers: Sit to/from Stand, Bed to chair/wheelchair/BSC Sit to Stand: Modified independent (Device/Increase time) (knee scooter;  education re: locking/unlocking brakes) Stand pivot transfers: Modified independent (Device/Increase time) (bed<>recliner without AD)              Ambulation/Gait Ambulation/Gait assistance: Modified independent (Device/Increase time) Gait Distance (Feet): 150 Feet (+ 150 ft) Assistive device: Knee scooter Gait Pattern/deviations: Step-to pattern Gait velocity: slightly decreased     General Gait Details: able to complete turn in small room  Stairs Stairs: Yes Stairs assistance: Supervision, Contact guard assist (CGA to keep pants from fallling) Stair Management: Two rails, Step to pattern Number of Stairs: 4 (6") General stair comments: Pt able to hop up/down steps with B rails & supervision<>CGA (to keep pants from falling); pt with good balance & arm strength  Wheelchair Mobility     Tilt Bed    Modified Rankin (Stroke Patients Only)       Balance Overall balance assessment: Needs assistance Sitting-balance support: Feet supported Sitting balance-Leahy Scale: Good     Standing balance support: During functional activity, Bilateral upper extremity supported Standing balance-Leahy Scale: Good                               Pertinent Vitals/Pain Pain Assessment Pain Assessment: No/denies pain    Home Living Family/patient expects to be discharged to:: Private residence Living Arrangements: Spouse/significant other Available Help at Discharge: Family Type of Home: House Home Access: Stairs to enter Entrance Stairs-Rails: Right;Left;Can reach both (at front door) Secretary/administrator of Steps: 4-5   Home Layout: One level Home Equipment:  (recently purchased knee scooter)  Prior Function Prior Level of Function : Independent/Modified Independent;Working/employed;Driving             Mobility Comments: working as Midwife for [employer], plays keyboard at [employer] Assessment   Upper Extremity  Assessment Upper Extremity Assessment: Overall WFL for tasks assessed    Lower Extremity Assessment Lower Extremity Assessment: LLE deficits/detail LLE Deficits / Details: LLE in dressing/ace wrap, reports LLE is still numb 2/2 nerve block    Cervical / Trunk Assessment Cervical / Trunk Assessment: Normal  Communication   Communication Communication: No apparent difficulties    Cognition Arousal: Alert Behavior During Therapy: WFL for tasks assessed/performed   PT - Cognitive impairments: No apparent impairments                         Following commands: Intact       Cueing Cueing Techniques: Verbal cues     General Comments      Exercises     Assessment/Plan    PT Assessment Patient needs continued PT services  PT Problem List Decreased strength;Decreased balance;Decreased range of motion;Decreased mobility;Decreased knowledge of precautions;Decreased knowledge of use of DME       PT Treatment Interventions DME instruction;Balance training;Gait training;Neuromuscular re-education;Stair training;Functional mobility training;Therapeutic activities;Therapeutic exercise;Patient/family education;Modalities    PT Goals (Current goals can be found in the Care Plan section)  Acute Rehab PT Goals Patient Stated Goal: go home as soon as possible PT Goal Formulation: With patient Time For Goal Achievement: 05/20/23 Potential to Achieve Goals: Good    Frequency Min 2X/week     Co-evaluation               AM-PAC PT "6 Clicks" Mobility  Outcome Measure Help needed turning from your back to your side while in a flat bed without using bedrails?: None Help needed moving from lying on your back to sitting on the side of a flat bed without using bedrails?: None Help needed moving to and from a bed to a chair (including a wheelchair)?: None Help needed standing up from a chair using your arms (e.g., wheelchair or bedside chair)?: None Help needed to walk in  hospital room?: None Help needed climbing 3-5 steps with a railing? : A Little 6 Click Score: 23    End of Session   Activity Tolerance: Patient tolerated treatment well Patient left: in chair;with call bell/phone within reach;with family/visitor present Nurse Communication: Mobility status PT Visit Diagnosis: Muscle weakness (generalized) (M62.81);Other abnormalities of gait and mobility (R26.89)    Time: 4098-1191 PT Time Calculation (min) (ACUTE ONLY): 21 min   Charges:   PT Evaluation $PT Eval Low Complexity: 1 Low   PT General Charges $$ ACUTE PT VISIT: 1 Visit         Emaline Handsome, PT, DPT 05/06/23, 10:33 AM   Venetta Gill 05/06/2023, 10:32 AM

## 2023-05-06 NOTE — Progress Notes (Signed)
 Patient and spouse was given discharge instructions, he acknowledge understanding, post op shoe was given to patient. Patient was taken to car by wheelchair, no distress or pain was noted.

## 2023-05-06 NOTE — Progress Notes (Incomplete)
 PROGRESS NOTE  Tom Edwards    DOB: 12-01-1960, 63 y.o.  XBJ:[id number]    Code Status: Full Code   DOA: 05/04/2023   LOS: 2   Brief hospital course  Tom Edwards is a 63 y.o. male with medical history significant for Prostate cancer s/p laparoscopic prostatectomy, DM, HTN, HLD and gout being admitted for R ankle large avulsion fracture involving the distal anterior aspect of the fibula along with several other findings. Dislocation reduced under conscious sedation in ED. Podiatry performed fixation 4/26. Pending PT evaluation   Assessment & Plan  Principal Problem:   Closed left ankle fracture, initial encounter Active Problems:   Type II diabetes mellitus (HCC)   Sleep apnea   Cancer of prostate with intermediate recurrence risk, stage T2B-C or Gleason 7 or prostate-specific antigen (PSA) 10-20 (HCC)  Closed left ankle fracture, initial encounter S/p reduction in the ED Pain control S/p fixation 4/26. PT eval needed prior to dc   Type II diabetes mellitus (HCC) A1c 7.1 on metformin  alone. Can dc insulin    Sleep apnea Does not currently use CPAP   Cancer of prostate with intermediate recurrence risk, stage T2B-C or Gleason 7 or prostate-specific antigen (PSA) 10-20 (HCC) Followed by [facility] No acute issues suspected  Body mass index is 30.13 kg/m.  VTE ppx: SCDs Start: 05/04/23 2011  Diet:     Diet   Diet Carb Modified Fluid consistency: Thin   Consultants: Podiatry   Subjective 05/06/23    Pt reports moderately well controlled pain while resting. He and wife are looking forward to him going home very soon   Objective   Vitals:   05/05/23 1558 05/05/23 1653 05/05/23 2059 05/06/23 0507  BP: 123/84 115/81 (!) 141/82 (!) 129/93  Pulse: 80 78 88 88  Resp: 16 16 17 18   Temp: (!) 97.2 F (36.2 C) 98.6 F (37 C) 98.4 F (36.9 C) 97.6 F (36.4 C)  TempSrc:    Oral  SpO2: 98% 95% 100% 97%  Weight:      Height:        Intake/Output Summary (Last 24 hours)  at 05/06/2023 0720 Last data filed at 05/05/2023 2100 Gross per 24 hour  Intake 1000 ml  Output 300 ml  Net 700 ml   Filed Weights   05/04/23 1134 05/04/23 2049  Weight: 88.5 kg 92.5 kg     Physical Exam:  General: awake, alert, NAD Respiratory: normal respiratory effort. Cardiovascular: distal perfusion intact Nervous: A&O x3. no gross focal neurologic deficits, normal speech Extremities: L ankle in brace.  Skin: dry, intact, normal temperature, normal color. No rashes, lesions or ulcers on exposed skin Psychiatry: normal mood, congruent affect  Labs   I have personally reviewed the following labs and imaging studies CBC    Component Value Date/Time   WBC 10.4 05/04/2023 1817   RBC 4.11 (L) 05/04/2023 1817   HGB 13.4 05/04/2023 1817   HGB 14.2 05/03/2022 0906   HCT 38.7 (L) 05/04/2023 1817   HCT 42.1 05/03/2022 0906   PLT 220 05/04/2023 1817   PLT 218 05/03/2022 0906   MCV 94.2 05/04/2023 1817   MCV 95 05/03/2022 0906   MCH 32.6 05/04/2023 1817   MCHC 34.6 05/04/2023 1817   RDW 12.9 05/04/2023 1817   RDW 13.6 05/03/2022 0906   LYMPHSABS 1.7 05/04/2023 1817   LYMPHSABS 2.3 05/03/2022 0906   MONOABS 0.7 05/04/2023 1817   EOSABS 0.0 05/04/2023 1817   EOSABS 0.1 05/03/2022 0906  BASOSABS 0.0 05/04/2023 1817   BASOSABS 0.0 05/03/2022 0906      Latest Ref Rng & Units 05/04/2023    6:17 PM 05/03/2022    9:06 AM 02/21/2021    8:11 AM  BMP  Glucose 70 - 99 mg/dL 956  213  086   BUN 8 - 23 mg/dL 15  12  12    Creatinine 0.61 - 1.24 mg/dL 5.78  4.69  6.29   BUN/Creat Ratio 10 - 24  10  10    Sodium 135 - 145 mmol/L 138  140  140   Potassium 3.5 - 5.1 mmol/L 3.6  4.8  4.0   Chloride 98 - 111 mmol/L 102  102  103   CO2 22 - 32 mmol/L 21     Calcium  8.9 - 10.3 mg/dL 8.8  9.2  9.4     DG Ankle 2 Views Left Result Date: 05/05/2023 CLINICAL DATA:  Distal fibular fracture EXAM: LEFT ANKLE - 2 VIEW COMPARISON:  05/04/2023 FLUOROSCOPY TIME:  Radiation Exposure Index (as  provided by the fluoroscopic device): 0.66 mGy If the device does not provide the exposure index: Fluoroscopy Time:  23 seconds Number of Acquired Images:  6 FINDINGS: Previously seen distal fibular fracture is again identified. Considerable soft tissue swelling is noted. There is also a tiny fragment noted adjacent to the medial malleolus similar to that seen on prior CT examination. Interval reduction of the fibular fracture was performed. Fixation sideplate is noted in satisfactory position. IMPRESSION: Status post ORIF distal fibular fracture. Electronically Signed   By: Violeta Grey M.D.   On: 05/05/2023 15:14   DG C-Arm 1-60 Min-No Report Result Date: 05/05/2023 Fluoroscopy was utilized by the requesting physician.  No radiographic interpretation.   DG C-Arm 1-60 Min-No Report Result Date: 05/05/2023 Fluoroscopy was utilized by the requesting physician.  No radiographic interpretation.   US  OR NERVE BLOCK-IMAGE ONLY [facility]) Result Date: 05/05/2023 There is no interpretation for this exam.  This order is for images obtained during a surgical procedure.  Please See "Surgeries" Tab for more information regarding the procedure.   US  OR NERVE BLOCK-IMAGE ONLY [facility]) Result Date: 05/05/2023 There is no interpretation for this exam.  This order is for images obtained during a surgical procedure.  Please See "Surgeries" Tab for more information regarding the procedure.   DG Ankle 2 Views Left Result Date: 05/04/2023 CLINICAL DATA:  Post reduction EXAM: LEFT ANKLE - 2 VIEW COMPARISON:  Left ankle x-ray 05/04/2023 FINDINGS: Again seen is an acute oblique fracture through the distal fibula. There is posterior distraction of the distal fracture fragment 1 cm, minimally improved from prior. There is minimal widening of the medial talotibial joint space which has also improved. IMPRESSION: Minimally improved alignment of the distal fibular fracture. There is diffuse soft tissue swelling of the ankle.  Electronically Signed   By: Tyron Gallon M.D.   On: 05/04/2023 18:30   DG Ankle 2 Views Left Result Date: 05/04/2023 CLINICAL DATA:  Status post reduction distal tibia and fibula fractures and subluxation. EXAM: LEFT ANKLE - 2 VIEW COMPARISON:  Radiographs and CT obtained earlier today. FINDINGS: Interval fiberglass splint without significant change in position and alignment of the previously described distal fibula and tibia fractures with subluxation. IMPRESSION: Interval fiberglass splint without significant change in position and alignment of the previously described distal fibula and tibia fractures with subluxation. Electronically Signed   By: Catherin Closs M.D.   On: 05/04/2023 16:19   CT Ankle Left  Wo Contrast Result Date: 05/04/2023 CLINICAL DATA:  Marvell Slider.  Complex ankle fractures. EXAM: CT OF THE LEFT ANKLE WITHOUT CONTRAST TECHNIQUE: Multidetector CT imaging of the left ankle was performed according to the standard protocol. Multiplanar CT image reconstructions were also generated. RADIATION DOSE REDUCTION: This exam was performed according to the departmental dose-optimization program which includes automated exposure control, adjustment of the mA and/or kV according to patient size and/or use of iterative reconstruction technique. COMPARISON:  Radiographs, same date. FINDINGS: Significant lateral subluxation of the talus in relation to the tibia with marked medial mortise widening consistent with medial ligamentous disruption. Long oblique coursing displaced distal fibular fracture at and above the level of the ankle mortise. Maximum displacement is 12 mm. There is also a large avulsion fracture involving the distal anterior aspect of the fibula. This is displaced approximately 12 mm. The medial malleolus is intact. There is a small impaction type fracture involving the posteroinferior lip of the distal tibia with small bone fragments and minimal disruption of the very posterior articular surface.  Evidence of a prior tibiofibular syndesmotic injury with heavy calcifications in the membrane. The talus is intact. Mild subchondral cystic changes are noted. The subtalar joints are maintained in the sinus tarsi is normal. No mid or hindfoot fractures are identified. Extensive subcutaneous soft tissue swelling/edema/hematoma associated with the fractures. IMPRESSION: 1. Significant lateral subluxation of the talus in relation to the tibia with marked medial mortise widening consistent with medial ligamentous disruption. 2. Long oblique coursing displaced distal fibular fracture at and above the level of the ankle mortise. 3. Large avulsion fracture involving the distal anterior aspect of the fibula. 4. Small impaction type fracture involving the posteroinferior lip of the distal tibia with small bone fragments and minimal disruption of the very posterior articular surface. 5. Evidence of a prior tibiofibular syndesmotic injury with heavy calcifications in the membrane. 6. Extensive subcutaneous soft tissue swelling/edema/hematoma associated with the fractures. Electronically Signed   By: Marrian Siva M.D.   On: 05/04/2023 14:21   DG Ankle Complete Left Result Date: 05/04/2023 CLINICAL DATA:  Left ankle injury after fall. EXAM: LEFT ANKLE COMPLETE - 3+ VIEW COMPARISON:  April 23, 2006. FINDINGS: Moderate lateral talotibial dislocation is noted with severely displaced distal left fibular oblique fracture. IMPRESSION: Severely displaced distal left fibular fracture with moderate lateral talotibial dislocation. Electronically Signed   By: Rosalene Colon M.D.   On: 05/04/2023 12:53    Disposition Plan & Communication  Patient status: Inpatient  Admitted From: Home Planned disposition location: Home Anticipated discharge date: today or tomorrow pending PT  Family Communication: wife at bedside    Author: Ree Candy, DO [facility] 05/06/2023, 7:20 AM   Available by Epic secure chat  7AM-7PM. If 7PM-7AM, please contact night-coverage.  [facility] contact information found on [email or web address].

## 2023-05-06 NOTE — Plan of Care (Signed)
   Problem: Education: Goal: Ability to describe self-care measures that may prevent or decrease complications (Diabetes Survival Skills Education) will improve Outcome: Progressing   Problem: Coping: Goal: Ability to adjust to condition or change in health will improve Outcome: Progressing   Problem: Tissue Perfusion: Goal: Adequacy of tissue perfusion will improve Outcome: Progressing

## 2023-05-06 NOTE — TOC Transition Note (Signed)
 Transition of Care [facility]) - Discharge Note   Patient Details  Name: Tom Edwards MRN: [id number] Date of Birth: Sep 15, 1960  [facility] CM/SW Contact:  Alexandra Ice, RN Phone Number: 05/06/2023, 10:40 AM   Clinical Narrative:    Brief assessment completed. Patient requesting crutches for discharge. Order sent to [facility] with [employer] for processing. Patient also has knee scooter at home. No other TOC needs identified, will continue monitor if additional needs should arise.    Barriers to Discharge: Barriers Resolved   Patient Goals and CMS Choice Patient states their goals for this hospitalization and ongoing recovery are:: go home and use my knee scooter      Expected Discharge Plan and Services           Expected Discharge Date: 05/06/23               DME Arranged: Crutches DME Agency: [employer] Date DME Agency Contacted: 05/06/23 Time DME Agency Contacted: 1040 Representative spoke with at DME Agency: [facility] Arranged: NA          Prior Living Arrangements/Services                       Activities of Daily Living   ADL Screening (condition at time of admission) Independently performs ADLs?: Yes (appropriate for developmental age) Is the patient deaf or have difficulty hearing?: No Does the patient have difficulty seeing, even when wearing glasses/contacts?: No Does the patient have difficulty concentrating, remembering, or making decisions?: No  Permission Sought/Granted                  Emotional Assessment              Admission diagnosis:  Ankle fracture [S82.899A] Closed bimalleolar fracture of left ankle, initial encounter [S82.842A] Closed left ankle fracture, initial encounter [W09.811B] Patient Active Problem List   Diagnosis Date Noted   Closed left ankle fracture, initial encounter 05/04/2023   Type II diabetes mellitus (HCC) 05/04/2023   Sleep apnea    Cancer of prostate with intermediate recurrence  risk, stage T2B-C or Gleason 7 or prostate-specific antigen (PSA) 10-20 (HCC) 11/02/2021   S/P inguinal hernia repair 08/07/2018   Inguinal hernia 07/16/2018   PCP:  Marcina Severe, PA-C Pharmacy:   [facility] [phone] [place], [place] - [address] AT [facility] OF [address] [place] [address] [place] [address] Phone: [phone] Fax: [phone]     Social Determinants of Health (SDOH) Interventions    Readmission Risk Interventions     No data to display            Final next level of care: Home/Self Care Barriers to Discharge: Barriers Resolved   Patient Goals and CMS Choice Patient states their goals for this hospitalization and ongoing recovery are:: go home and use my knee scooter          Discharge Placement                Patient to be transferred to facility by: Spouse Name of family member notified: Shelvy Dickens Patient and family notified of of transfer: 05/06/23  Discharge Plan and Services Additional resources added to the After Visit Summary for                  DME Arranged: Crutches DME Agency: [employer] Date DME Agency Contacted: 05/06/23 Time DME Agency Contacted: 1040 Representative spoke with  at DME Agency: [facility] Arranged: NA          Social Drivers of Health (SDOH) Interventions SDOH Screenings   Food Insecurity: No Food Insecurity (05/04/2023)  Housing: Low Risk  (05/04/2023)  Transportation Needs: No Transportation Needs (05/04/2023)  Utilities: Not At Risk (05/04/2023)  Financial Resource Strain: Low Risk  (05/25/2022)   Received from [facility]  Social Connections: Socially Integrated (05/04/2023)  Tobacco Use: High Risk (05/04/2023)     Readmission Risk Interventions     No data to display

## 2023-05-06 NOTE — Progress Notes (Addendum)
 PODIATRY / FOOT AND ANKLE SURGERY PROGRESS NOTE   HPI: Tom Edwards is a 63 y.o. male who presents status post 1 day left ankle fracture open reduction with internal fixation.  Patient notes minimal to no pain at this time and still has numbness in the leg likely secondary to the nerve block performed by anesthesia.  He is getting around pretty well in the bedside chair but has yet to work with physical therapy.  He has ordered a knee scooter and is requesting crutches as well.  Order was placed yesterday for crutches.  Patient wonders when he can be discharged.  PMHx:  Past Medical History:  Diagnosis Date   Acute medial meniscus tear    DM (diabetes mellitus), type 2 (HCC)    Gout    Hyperlipemia    Hypertension    Over weight    Sedentary lifestyle    Sleep apnea    no cpap   Vertigo    Vertigo     Surgical Hx:  Past Surgical History:  Procedure Laterality Date   COLONOSCOPY     COLONOSCOPY WITH PROPOFOL  N/A 02/11/2021   Procedure: COLONOSCOPY WITH PROPOFOL ;  Surgeon: Shane Darling, MD;  Location: [facility] END[facility]COPY;  Service: Endoscopy;  Laterality: N/A;   INGUINAL HERNIA REPAIR Left 07/24/2018   Procedure: HERNIA REPAIR INGUINAL ADULT, LEFT OPEN;  Surgeon: Marshall Skeeter, MD;  Location: [facility] ORS;  Service: General;  Laterality: Left;    FHx:  Family History  Problem Relation Age of Onset   Diabetes Mother    Cerebrovascular Accident Mother    Prostate cancer Father    Hypertension Father    Diabetes Brother     Social History:  reports that he has been smoking cigars. He has been exposed to tobacco smoke. He has never used smokeless tobacco. He reports current alcohol use of about 10.0 standard drinks of alcohol per week. He reports that he does not use drugs.  Allergies: No Known Allergies  Medications Prior to Admission  Medication Sig Dispense Refill   allopurinol  (ZYLOPRIM ) 300 MG tablet Take 1 tablet (300 mg total) by mouth daily. 90 tablet 2   amLODipine   (NORVASC ) 10 MG tablet Take 1 tablet (10 mg total) by mouth daily. 90 tablet 3   bisoprolol  (ZEBETA ) 10 MG tablet Take 1 tablet (10 mg total) by mouth daily. 90 tablet 3   metFORMIN  (GLUCOPHAGE ) 500 MG tablet Take 1 tablet (500 mg total) by mouth 2 (two) times daily with a meal. 180 tablet 3   sildenafil (VIAGRA) 25 MG tablet Take 25 mg by mouth daily as needed for erectile dysfunction. Prostate removed      Physical Exam: General: Alert and oriented.  No apparent distress.  Splint dressing to left lower extremity clean, dry, and intact with no strikethrough.  Patient has some loss of sensation to digits of left foot likely secondary to block, is able to slightly flex and extend digits of left foot.  Capillary fill time appears to be intact to digits left foot.  Results for orders placed or performed during the hospital encounter of 05/04/23 (from the past 48 hours)  CBC with Differential     Status: Abnormal   Collection Time: 05/04/23  6:17 PM  Result Value Ref Range   WBC 10.4 4.0 - 10.5 K/uL   RBC 4.11 (L) 4.22 - 5.81 MIL/uL   Hemoglobin 13.4 13.0 - 17.0 g/dL   HCT 16.1 (L) 09.6 - 04.5 %  MCV 94.2 80.0 - 100.0 fL   MCH 32.6 26.0 - 34.0 pg   MCHC 34.6 30.0 - 36.0 g/dL   RDW 29.5 62.1 - 30.8 %   Platelets 220 150 - 400 K/uL   nRBC 0.0 0.0 - 0.2 %   Neutrophils Relative % 78 %   Neutro Abs 8.0 (H) 1.7 - 7.7 K/uL   Lymphocytes Relative 16 %   Lymphs Abs 1.7 0.7 - 4.0 K/uL   Monocytes Relative 6 %   Monocytes Absolute 0.7 0.1 - 1.0 K/uL   Eosinophils Relative 0 %   Eosinophils Absolute 0.0 0.0 - 0.5 K/uL   Basophils Relative 0 %   Basophils Absolute 0.0 0.0 - 0.1 K/uL   Immature Granulocytes 0 %   Abs Immature Granulocytes 0.04 0.00 - 0.07 K/uL    Comment: Performed at [facility], [address], [place], [place] [address]  Basic metabolic panel     Status: Abnormal   Collection Time: 05/04/23  6:17 PM  Result Value Ref Range   Sodium 138 135 - 145 mmol/L    Potassium 3.6 3.5 - 5.1 mmol/L   Chloride 102 98 - 111 mmol/L   CO2 21 (L) 22 - 32 mmol/L   Glucose, Bld 208 (H) 70 - 99 mg/dL    Comment: Glucose reference range applies only to samples taken after fasting for at least 8 hours.   BUN 15 8 - 23 mg/dL   Creatinine, Ser 6.96 0.61 - 1.24 mg/dL   Calcium  8.8 (L) 8.9 - 10.3 mg/dL   GFR, Estimated >29 >52 mL/min    Comment: (NOTE) Calculated using the CKD-EPI Creatinine Equation (2021)    Anion gap 15 5 - 15    Comment: Performed at [facility], [address] [place], [place] [address]  Hemoglobin A1c     Status: Abnormal   Collection Time: 05/04/23  8:10 PM  Result Value Ref Range   Hgb A1c MFr Bld 7.1 (H) 4.8 - 5.6 %    Comment: (NOTE) Pre diabetes:          5.7%-6.4%  Diabetes:              >6.4%  Glycemic control for   <7.0% adults with diabetes    Mean Plasma Glucose 157.07 mg/dL    Comment: Performed at [facility], [address] [place], [place] [address]  HIV Antibody (routine testing w rflx)     Status: None   Collection Time: 05/04/23  9:18 PM  Result Value Ref Range   HIV Screen 4th Generation wRfx Non Reactive Non Reactive    Comment: Performed at [facility], [address] [place], [place] [address]  Glucose, capillary     Status: Abnormal   Collection Time: 05/04/23  9:22 PM  Result Value Ref Range   Glucose-Capillary 217 (H) 70 - 99 mg/dL    Comment: Glucose reference range applies only to samples taken after fasting for at least 8 hours.  Glucose, capillary     Status: Abnormal   Collection Time: 05/05/23 12:06 AM  Result Value Ref Range   Glucose-Capillary 220 (H) 70 - 99 mg/dL    Comment: Glucose reference range applies only to samples taken after fasting for at least 8 hours.  Glucose, capillary     Status: Abnormal   Collection Time: 05/05/23  4:02 AM  Result Value Ref Range   Glucose-Capillary 114 (H) 70 - 99 mg/dL  Comment: Glucose reference range applies only to samples  taken after fasting for at least 8 hours.  Type and screen [facility]     Status: None   Collection Time: 05/05/23  4:45 AM  Result Value Ref Range   ABO/RH(D) O POS    Antibody Screen NEG    Sample Expiration      05/08/2023,2359 Performed at [facility], [address] [place], [place] [address]   Glucose, capillary     Status: Abnormal   Collection Time: 05/05/23  7:20 AM  Result Value Ref Range   Glucose-Capillary 262 (H) 70 - 99 mg/dL    Comment: Glucose reference range applies only to samples taken after fasting for at least 8 hours.  Glucose, capillary     Status: Abnormal   Collection Time: 05/05/23  8:12 AM  Result Value Ref Range   Glucose-Capillary 234 (H) 70 - 99 mg/dL    Comment: Glucose reference range applies only to samples taken after fasting for at least 8 hours.  Glucose, capillary     Status: Abnormal   Collection Time: 05/05/23  3:27 PM  Result Value Ref Range   Glucose-Capillary 165 (H) 70 - 99 mg/dL    Comment: Glucose reference range applies only to samples taken after fasting for at least 8 hours.  Glucose, capillary     Status: Abnormal   Collection Time: 05/06/23  7:53 AM  Result Value Ref Range   Glucose-Capillary 136 (H) 70 - 99 mg/dL    Comment: Glucose reference range applies only to samples taken after fasting for at least 8 hours.   DG Ankle 2 Views Left Result Date: 05/05/2023 CLINICAL DATA:  Distal fibular fracture EXAM: LEFT ANKLE - 2 VIEW COMPARISON:  05/04/2023 FLUOROSCOPY TIME:  Radiation Exposure Index (as provided by the fluoroscopic device): 0.66 mGy If the device does not provide the exposure index: Fluoroscopy Time:  23 seconds Number of Acquired Images:  6 FINDINGS: Previously seen distal fibular fracture is again identified. Considerable soft tissue swelling is noted. There is also a tiny fragment noted adjacent to the medial malleolus similar to that seen on prior CT examination. Interval reduction of the  fibular fracture was performed. Fixation sideplate is noted in satisfactory position. IMPRESSION: Status post ORIF distal fibular fracture. Electronically Signed   By: Violeta Grey M.D.   On: 05/05/2023 15:14   DG C-Arm 1-60 Min-No Report Result Date: 05/05/2023 Fluoroscopy was utilized by the requesting physician.  No radiographic interpretation.   DG C-Arm 1-60 Min-No Report Result Date: 05/05/2023 Fluoroscopy was utilized by the requesting physician.  No radiographic interpretation.   US  OR NERVE BLOCK-IMAGE ONLY [facility]) Result Date: 05/05/2023 There is no interpretation for this exam.  This order is for images obtained during a surgical procedure.  Please See "Surgeries" Tab for more information regarding the procedure.   US  OR NERVE BLOCK-IMAGE ONLY [facility]) Result Date: 05/05/2023 There is no interpretation for this exam.  This order is for images obtained during a surgical procedure.  Please See "Surgeries" Tab for more information regarding the procedure.   DG Ankle 2 Views Left Result Date: 05/04/2023 CLINICAL DATA:  Post reduction EXAM: LEFT ANKLE - 2 VIEW COMPARISON:  Left ankle x-ray 05/04/2023 FINDINGS: Again seen is an acute oblique fracture through the distal fibula. There is posterior distraction of the distal fracture fragment 1 cm, minimally improved from prior. There is minimal widening of the medial talotibial joint space which has also improved.  IMPRESSION: Minimally improved alignment of the distal fibular fracture. There is diffuse soft tissue swelling of the ankle. Electronically Signed   By: Tyron Gallon M.D.   On: 05/04/2023 18:30   DG Ankle 2 Views Left Result Date: 05/04/2023 CLINICAL DATA:  Status post reduction distal tibia and fibula fractures and subluxation. EXAM: LEFT ANKLE - 2 VIEW COMPARISON:  Radiographs and CT obtained earlier today. FINDINGS: Interval fiberglass splint without significant change in position and alignment of the previously described distal fibula  and tibia fractures with subluxation. IMPRESSION: Interval fiberglass splint without significant change in position and alignment of the previously described distal fibula and tibia fractures with subluxation. Electronically Signed   By: Catherin Closs M.D.   On: 05/04/2023 16:19   CT Ankle Left Wo Contrast Result Date: 05/04/2023 CLINICAL DATA:  Marvell Slider.  Complex ankle fractures. EXAM: CT OF THE LEFT ANKLE WITHOUT CONTRAST TECHNIQUE: Multidetector CT imaging of the left ankle was performed according to the standard protocol. Multiplanar CT image reconstructions were also generated. RADIATION DOSE REDUCTION: This exam was performed according to the departmental dose-optimization program which includes automated exposure control, adjustment of the mA and/or kV according to patient size and/or use of iterative reconstruction technique. COMPARISON:  Radiographs, same date. FINDINGS: Significant lateral subluxation of the talus in relation to the tibia with marked medial mortise widening consistent with medial ligamentous disruption. Long oblique coursing displaced distal fibular fracture at and above the level of the ankle mortise. Maximum displacement is 12 mm. There is also a large avulsion fracture involving the distal anterior aspect of the fibula. This is displaced approximately 12 mm. The medial malleolus is intact. There is a small impaction type fracture involving the posteroinferior lip of the distal tibia with small bone fragments and minimal disruption of the very posterior articular surface. Evidence of a prior tibiofibular syndesmotic injury with heavy calcifications in the membrane. The talus is intact. Mild subchondral cystic changes are noted. The subtalar joints are maintained in the sinus tarsi is normal. No mid or hindfoot fractures are identified. Extensive subcutaneous soft tissue swelling/edema/hematoma associated with the fractures. IMPRESSION: 1. Significant lateral subluxation of the talus in  relation to the tibia with marked medial mortise widening consistent with medial ligamentous disruption. 2. Long oblique coursing displaced distal fibular fracture at and above the level of the ankle mortise. 3. Large avulsion fracture involving the distal anterior aspect of the fibula. 4. Small impaction type fracture involving the posteroinferior lip of the distal tibia with small bone fragments and minimal disruption of the very posterior articular surface. 5. Evidence of a prior tibiofibular syndesmotic injury with heavy calcifications in the membrane. 6. Extensive subcutaneous soft tissue swelling/edema/hematoma associated with the fractures. Electronically Signed   By: Marrian Siva M.D.   On: 05/04/2023 14:21   DG Ankle Complete Left Result Date: 05/04/2023 CLINICAL DATA:  Left ankle injury after fall. EXAM: LEFT ANKLE COMPLETE - 3+ VIEW COMPARISON:  April 23, 2006. FINDINGS: Moderate lateral talotibial dislocation is noted with severely displaced distal left fibular oblique fracture. IMPRESSION: Severely displaced distal left fibular fracture with moderate lateral talotibial dislocation. Electronically Signed   By: Rosalene Colon M.D.   On: 05/04/2023 12:53    Blood pressure (!) 136/94, pulse 80, temperature 98 F (36.7 C), resp. rate 18, height 5\' 9"  (1.753 m), weight 92.5 kg, SpO2 98%.  Assessment Left ankle bimalleolar fracture, closed, displaced with deltoid ligament disruption status post ORIF and deltoid ligament repair  Plan - Patient  seen and examined. - Preop and postop x-ray imaging reviewed and discussed with patient in detail. - Patient to keep splint dressing clean, dry, and intact until postoperative visit sometime in the next 1 to 2 weeks.  Patient to remain nonweightbearing at all times left lower extremity using crutches or knee scooter or wheelchair. - Appreciate PT/OT recs.  Patient can be discharged after being seen from my standpoint.  Order was placed yesterday post  surgery - Recommend discharging on Augmentin 7-day course, Percocet 5/325 1 p.o. every 6 hours as needed pain, 28 tablets, and aspirin 81 mg twice daily starting today. - Patient also work on knee flexion and extension exercises daily with calf massages.  Podiatry team to sign off at this time, discharge instructions placed in chart.  Pink Bridges, DPM 05/06/2023, 9:55 AM

## 2023-05-06 NOTE — Discharge Summary (Signed)
 Physician Discharge Summary  Patient: Tom Edwards WJX:[id number] DOB: May 12, 1960   Code Status: Full Code Admit date: 05/04/2023 Discharge date: 05/06/2023 Disposition: Home, No home health services recommended PCP: Marcina Severe, PA-C  Recommendations for Outpatient Follow-up:  Follow up with PCP within 1-2 weeks Regarding general hospital follow up and preventative care Follow up with podiatry   Discharge Diagnoses:  Principal Problem:   Closed left ankle fracture, initial encounter Active Problems:   Type II diabetes mellitus (HCC)   Sleep apnea   Cancer of prostate with intermediate recurrence risk, stage T2B-C or Gleason 7 or prostate-specific antigen (PSA) 10-20 [facility])  Brief Hospital Course Summary: Tom Edwards is a 63 y.o. male with medical history significant for Prostate cancer s/p laparoscopic prostatectomy, DM, HTN, HLD and gout being admitted for R ankle large avulsion fracture involving the distal anterior aspect of the fibula along with several other findings. Dislocation reduced under conscious sedation in ED. Podiatry performed fixation 4/26. Evaluated by PT and discharged in stable condition with pain control and ppx antibiotics and dvt per podiatry recs.   All other chronic conditions were treated with home medications.    Discharge Condition: Good, improved Recommended discharge diet: Regular healthy diet  Consultations: Podiatry   Procedures/Studies: Reduction ORIF  Allergies as of 05/06/2023   No Known Allergies      Medication List     TAKE these medications    acetaminophen  325 MG tablet Commonly known as: TYLENOL  Take 2 tablets (650 mg total) by mouth every 6 (six) hours as needed for up to 15 days for mild pain (pain score 1-3) (or Fever >/= 101).   allopurinol  300 MG tablet Commonly known as: ZYLOPRIM  Take 1 tablet (300 mg total) by mouth daily.   amLODipine  10 MG tablet Commonly known as: NORVASC  Take 1 tablet (10 mg total) by  mouth daily.   amoxicillin-clavulanate 875-125 MG tablet Commonly known as: AUGMENTIN Take 1 tablet by mouth 2 (two) times daily for 6 days.   aspirin EC 81 MG tablet Take 1 tablet (81 mg total) by mouth 2 (two) times daily for 15 days. Swallow whole.   bisoprolol  10 MG tablet Commonly known as: ZEBETA  Take 1 tablet (10 mg total) by mouth daily.   metFORMIN  500 MG tablet Commonly known as: GLUCOPHAGE  Take 1 tablet (500 mg total) by mouth 2 (two) times daily with a meal.   oxyCODONE  5 MG immediate release tablet Commonly known as: Oxy IR/ROXICODONE  Take 1 tablet (5 mg total) by mouth every 6 (six) hours as needed for up to 3 days for severe pain (pain score 7-10).   sildenafil 25 MG tablet Commonly known as: VIAGRA Take 25 mg by mouth daily as needed for erectile dysfunction. Prostate removed               Durable Medical Equipment  (From admission, onward)           Start     Ordered   05/06/23 1005  For home use only DME Crutches  Once        05/06/23 1005            Follow-up Information     Pink Bridges, DPM. Schedule an appointment as soon as possible for a visit in 1 week(s).   Specialty: Podiatry Why: For wound re-check Contact information: [address] [place] [address] [phone]  Subjective   Pt reports feeling great. Still has significant effect from nerve block and cannot feel or move his toes on left foot.   All questions and concerns were addressed at time of discharge.  Objective  Blood pressure (!) 136/94, pulse 80, temperature 98 F (36.7 C), resp. rate 18, height 5\' 9"  (1.753 m), weight 92.5 kg, SpO2 98%.   General: Pt is alert, awake, not in acute distress Cardiovascular: RRR, S1/S2 +, no rubs, no gallops Respiratory: CTA bilaterally, no wheezing, no rhonchi Extremities: R foot in casting. Distal circulation intact. No movement of toes.   The results of significant diagnostics from this  hospitalization (including imaging, microbiology, ancillary and laboratory) are listed below for reference.   Imaging studies: DG Ankle 2 Views Left Result Date: 05/05/2023 CLINICAL DATA:  Distal fibular fracture EXAM: LEFT ANKLE - 2 VIEW COMPARISON:  05/04/2023 FLUOROSCOPY TIME:  Radiation Exposure Index (as provided by the fluoroscopic device): 0.66 mGy If the device does not provide the exposure index: Fluoroscopy Time:  23 seconds Number of Acquired Images:  6 FINDINGS: Previously seen distal fibular fracture is again identified. Considerable soft tissue swelling is noted. There is also a tiny fragment noted adjacent to the medial malleolus similar to that seen on prior CT examination. Interval reduction of the fibular fracture was performed. Fixation sideplate is noted in satisfactory position. IMPRESSION: Status post ORIF distal fibular fracture. Electronically Signed   By: Violeta Grey M.D.   On: 05/05/2023 15:14   DG C-Arm 1-60 Min-No Report Result Date: 05/05/2023 Fluoroscopy was utilized by the requesting physician.  No radiographic interpretation.   DG C-Arm 1-60 Min-No Report Result Date: 05/05/2023 Fluoroscopy was utilized by the requesting physician.  No radiographic interpretation.   US  OR NERVE BLOCK-IMAGE ONLY [facility]) Result Date: 05/05/2023 There is no interpretation for this exam.  This order is for images obtained during a surgical procedure.  Please See "Surgeries" Tab for more information regarding the procedure.   US  OR NERVE BLOCK-IMAGE ONLY [facility]) Result Date: 05/05/2023 There is no interpretation for this exam.  This order is for images obtained during a surgical procedure.  Please See "Surgeries" Tab for more information regarding the procedure.   DG Ankle 2 Views Left Result Date: 05/04/2023 CLINICAL DATA:  Post reduction EXAM: LEFT ANKLE - 2 VIEW COMPARISON:  Left ankle x-ray 05/04/2023 FINDINGS: Again seen is an acute oblique fracture through the distal fibula. There  is posterior distraction of the distal fracture fragment 1 cm, minimally improved from prior. There is minimal widening of the medial talotibial joint space which has also improved. IMPRESSION: Minimally improved alignment of the distal fibular fracture. There is diffuse soft tissue swelling of the ankle. Electronically Signed   By: Tyron Gallon M.D.   On: 05/04/2023 18:30   DG Ankle 2 Views Left Result Date: 05/04/2023 CLINICAL DATA:  Status post reduction distal tibia and fibula fractures and subluxation. EXAM: LEFT ANKLE - 2 VIEW COMPARISON:  Radiographs and CT obtained earlier today. FINDINGS: Interval fiberglass splint without significant change in position and alignment of the previously described distal fibula and tibia fractures with subluxation. IMPRESSION: Interval fiberglass splint without significant change in position and alignment of the previously described distal fibula and tibia fractures with subluxation. Electronically Signed   By: Catherin Closs M.D.   On: 05/04/2023 16:19   CT Ankle Left Wo Contrast Result Date: 05/04/2023 CLINICAL DATA:  Marvell Slider.  Complex ankle fractures. EXAM: CT OF THE LEFT ANKLE WITHOUT CONTRAST TECHNIQUE:  Multidetector CT imaging of the left ankle was performed according to the standard protocol. Multiplanar CT image reconstructions were also generated. RADIATION DOSE REDUCTION: This exam was performed according to the departmental dose-optimization program which includes automated exposure control, adjustment of the mA and/or kV according to patient size and/or use of iterative reconstruction technique. COMPARISON:  Radiographs, same date. FINDINGS: Significant lateral subluxation of the talus in relation to the tibia with marked medial mortise widening consistent with medial ligamentous disruption. Long oblique coursing displaced distal fibular fracture at and above the level of the ankle mortise. Maximum displacement is 12 mm. There is also a large avulsion fracture  involving the distal anterior aspect of the fibula. This is displaced approximately 12 mm. The medial malleolus is intact. There is a small impaction type fracture involving the posteroinferior lip of the distal tibia with small bone fragments and minimal disruption of the very posterior articular surface. Evidence of a prior tibiofibular syndesmotic injury with heavy calcifications in the membrane. The talus is intact. Mild subchondral cystic changes are noted. The subtalar joints are maintained in the sinus tarsi is normal. No mid or hindfoot fractures are identified. Extensive subcutaneous soft tissue swelling/edema/hematoma associated with the fractures. IMPRESSION: 1. Significant lateral subluxation of the talus in relation to the tibia with marked medial mortise widening consistent with medial ligamentous disruption. 2. Long oblique coursing displaced distal fibular fracture at and above the level of the ankle mortise. 3. Large avulsion fracture involving the distal anterior aspect of the fibula. 4. Small impaction type fracture involving the posteroinferior lip of the distal tibia with small bone fragments and minimal disruption of the very posterior articular surface. 5. Evidence of a prior tibiofibular syndesmotic injury with heavy calcifications in the membrane. 6. Extensive subcutaneous soft tissue swelling/edema/hematoma associated with the fractures. Electronically Signed   By: Marrian Siva M.D.   On: 05/04/2023 14:21   DG Ankle Complete Left Result Date: 05/04/2023 CLINICAL DATA:  Left ankle injury after fall. EXAM: LEFT ANKLE COMPLETE - 3+ VIEW COMPARISON:  April 23, 2006. FINDINGS: Moderate lateral talotibial dislocation is noted with severely displaced distal left fibular oblique fracture. IMPRESSION: Severely displaced distal left fibular fracture with moderate lateral talotibial dislocation. Electronically Signed   By: Rosalene Colon M.D.   On: 05/04/2023 12:53    Labs: Basic Metabolic  Panel: Recent Labs  Lab 05/04/23 1817  NA 138  K 3.6  CL 102  CO2 21*  GLUCOSE 208*  BUN 15  CREATININE 1.14  CALCIUM  8.8*   CBC: Recent Labs  Lab 05/04/23 1817  WBC 10.4  NEUTROABS 8.0*  HGB 13.4  HCT 38.7*  MCV 94.2  PLT 220   Microbiology: Results for orders placed or performed in visit on 01/28/20  Novel Coronavirus, NAA ([facility])     Status: None   Collection Time: 01/28/20 10:00 AM   Specimen: Nasopharyngeal(NP) swabs in vial transport medium   Nasopharynge  Result Value Ref Range Status   SARS-CoV-2, NAA Not Detected Not Detected Final    Comment: This nucleic acid amplification test was developed and its performance characteristics determined by [employer]. Nucleic acid amplification tests include RT-PCR and TMA. This test has not been FDA cleared or approved. This test has been authorized by FDA under an Emergency Use Authorization (EUA). This test is only authorized for the duration of time the declaration that circumstances exist justifying the authorization of the emergency use of in vitro diagnostic tests for detection of SARS-CoV-2 virus and/or  diagnosis of COVID-19 infection under section 564(b)(1) of the Act, 21 U.S.C. 161WRU-0(A) (1), unless the authorization is terminated or revoked sooner. When diagnostic testing is negative, the possibility of a false negative result should be considered in the context of a patient's recent exposures and the presence of clinical signs and symptoms consistent with COVID-19. An individual without symptoms of COVID-19 and who is not shedding SARS-CoV-2 virus wo uld expect to have a negative (not detected) result in this assay.   SARS-COV-2, NAA 2 DAY TAT     Status: None   Collection Time: 01/28/20 10:00 AM   Nasopharynge  Result Value Ref Range Status   SARS-CoV-2, NAA 2 DAY TAT Performed  Final    Time coordinating discharge: Over 30 minutes  Ree Candy, MD  [facility] 05/06/2023, 10:55 AM

## 2023-05-06 NOTE — Plan of Care (Signed)
  Problem: Fluid Volume: Goal: Ability to maintain a balanced intake and output will improve Outcome: Progressing   Problem: Health Behavior/Discharge Planning: Goal: Ability to manage health-related needs will improve Outcome: Progressing   Problem: Nutritional: Goal: Maintenance of adequate nutrition will improve Outcome: Progressing   Problem: Education: Goal: Knowledge of General Education information will improve Description: Including pain rating scale, medication(s)/side effects and non-pharmacologic comfort measures Outcome: Progressing   Problem: Nutrition: Goal: Adequate nutrition will be maintained Outcome: Progressing

## 2023-05-07 ENCOUNTER — Encounter: Payer: Self-pay | Admitting: Podiatry

## 2023-05-07 LAB — GLUCOSE, CAPILLARY: Glucose-Capillary: 109 mg/dL — ABNORMAL HIGH (ref 70–99)

## 2023-05-10 ENCOUNTER — Ambulatory Visit: Payer: Self-pay

## 2023-05-10 DIAGNOSIS — E119 Type 2 diabetes mellitus without complications: Secondary | ICD-10-CM

## 2023-05-10 DIAGNOSIS — Z0000 Encounter for general adult medical examination without abnormal findings: Secondary | ICD-10-CM

## 2023-05-10 LAB — POCT URINALYSIS DIPSTICK
Bilirubin, UA: POSITIVE
Blood, UA: NEGATIVE
Glucose, UA: NEGATIVE
Ketones, UA: NEGATIVE
Leukocytes, UA: NEGATIVE
Nitrite, UA: NEGATIVE
Protein, UA: NEGATIVE
Spec Grav, UA: 1.02 (ref 1.010–1.025)
Urobilinogen, UA: 0.2 U/dL
pH, UA: 6 (ref 5.0–8.0)

## 2023-05-11 LAB — CMP12+LP+TP+TSH+6AC+PSA+CBC…
ALT: 17 IU/L (ref 0–44)
AST: 16 IU/L (ref 0–40)
Albumin: 4.5 g/dL (ref 3.9–4.9)
Alkaline Phosphatase: 65 IU/L (ref 44–121)
BUN/Creatinine Ratio: 11 (ref 10–24)
BUN: 13 mg/dL (ref 8–27)
Basophils Absolute: 0 10*3/uL (ref 0.0–0.2)
Basos: 1 %
Bilirubin Total: 0.8 mg/dL (ref 0.0–1.2)
Calcium: 9.2 mg/dL (ref 8.6–10.2)
Chloride: 99 mmol/L (ref 96–106)
Chol/HDL Ratio: 5 ratio (ref 0.0–5.0)
Cholesterol, Total: 174 mg/dL (ref 100–199)
Creatinine, Ser: 1.15 mg/dL (ref 0.76–1.27)
EOS (ABSOLUTE): 0.1 10*3/uL (ref 0.0–0.4)
Eos: 1 %
Estimated CHD Risk: 1 times avg. (ref 0.0–1.0)
Free Thyroxine Index: 2.4 (ref 1.2–4.9)
GGT: 20 IU/L (ref 0–65)
Globulin, Total: 2.8 g/dL (ref 1.5–4.5)
Glucose: 176 mg/dL — ABNORMAL HIGH (ref 70–99)
HDL: 35 mg/dL — ABNORMAL LOW (ref >39)
Hematocrit: 39.8 % (ref 37.5–51.0)
Hemoglobin: 13.8 g/dL (ref 13.0–17.7)
Immature Grans (Abs): 0 10*3/uL (ref 0.0–0.1)
Immature Granulocytes: 0 %
Iron: 72 ug/dL (ref 38–169)
LDH: 200 IU/L (ref 121–224)
LDL Chol Calc (NIH): 122 mg/dL — ABNORMAL HIGH (ref 0–99)
Lymphocytes Absolute: 1.7 10*3/uL (ref 0.7–3.1)
Lymphs: 31 %
MCH: 32.4 pg (ref 26.6–33.0)
MCHC: 34.7 g/dL (ref 31.5–35.7)
MCV: 93 fL (ref 79–97)
Monocytes Absolute: 0.3 10*3/uL (ref 0.1–0.9)
Monocytes: 6 %
Neutrophils Absolute: 3.3 10*3/uL (ref 1.4–7.0)
Neutrophils: 61 %
Phosphorus: 4.7 mg/dL — ABNORMAL HIGH (ref 2.8–4.1)
Platelets: 310 10*3/uL (ref 150–450)
Potassium: 4.4 mmol/L (ref 3.5–5.2)
Prostate Specific Ag, Serum: <0.1 ng/mL (ref 0.0–4.0)
RBC: 4.26 x10E6/uL (ref 4.14–5.80)
RDW: 12.6 % (ref 11.6–15.4)
Sodium: 137 mmol/L (ref 134–144)
T3 Uptake Ratio: 33 % (ref 24–39)
T4, Total: 7.3 ug/dL (ref 4.5–12.0)
TSH: 1.55 u[IU]/mL (ref 0.450–4.500)
Total Protein: 7.3 g/dL (ref 6.0–8.5)
Triglycerides: 94 mg/dL (ref 0–149)
Uric Acid: 8.3 mg/dL (ref 3.8–8.4)
VLDL Cholesterol Cal: 17 mg/dL (ref 5–40)
WBC: 5.5 10*3/uL (ref 3.4–10.8)
eGFR: 72 mL/min/{1.73_m2} (ref >59)

## 2023-05-11 LAB — MICROALBUMIN / CREATININE URINE RATIO
Creatinine, Urine: 253.7 mg/dL
Microalb/Creat Ratio: 2 mg/g{creat} (ref 0–29)
Microalbumin, Urine: 6 ug/mL

## 2023-05-11 LAB — HGB A1C W/O EAG: Hgb A1c MFr Bld: 7.6 % — ABNORMAL HIGH (ref 4.8–5.6)

## 2023-05-14 DIAGNOSIS — S82842D Displaced bimalleolar fracture of left lower leg, subsequent encounter for closed fracture with routine healing: Secondary | ICD-10-CM | POA: Diagnosis not present

## 2023-05-17 ENCOUNTER — Encounter: Payer: Self-pay | Admitting: Physician Assistant

## 2023-05-17 ENCOUNTER — Ambulatory Visit: Payer: Self-pay | Admitting: Physician Assistant

## 2023-05-17 VITALS — BP 127/78 | HR 78 | Resp 14 | Ht 68.0 in | Wt 193.0 lb

## 2023-05-17 DIAGNOSIS — Z0000 Encounter for general adult medical examination without abnormal findings: Secondary | ICD-10-CM

## 2023-05-17 MED ORDER — TADALAFIL 10 MG PO TABS
10.0000 mg | ORAL_TABLET | ORAL | 1 refills | Status: DC | PRN
Start: 2023-05-17 — End: 2023-10-19

## 2023-05-17 MED ORDER — METFORMIN HCL 1000 MG PO TABS: 1000.0000 mg | ORAL_TABLET | Freq: Two times a day (BID) | ORAL | 3 refills | Status: AC

## 2023-05-17 NOTE — Progress Notes (Signed)
 Pt presents today to complete physical, denies any concerns at this time. Tom Edwards

## 2023-05-17 NOTE — Progress Notes (Signed)
 [facility] ____________________________________________   None    (approximate)  I have reviewed the triage vital signs and the nursing notes.   HISTORY  Chief Complaint No chief complaint on file.   HPI Tom Edwards is a 63 y.o. male patient presents for annual physical exam.  Patient is status postOpen reduction for left ankle fracture.         Past Medical History:  Diagnosis Date   Acute medial meniscus tear    DM (diabetes mellitus), type 2 (HCC)    Gout    Hyperlipemia    Hypertension    Over weight    Sedentary lifestyle    Sleep apnea    no cpap   Vertigo    Vertigo     Patient Active Problem List   Diagnosis Date Noted   Closed left ankle fracture, initial encounter 05/04/2023   Type II diabetes mellitus (HCC) 05/04/2023   Sleep apnea    Cancer of prostate with intermediate recurrence risk, stage T2B-C or Gleason 7 or prostate-specific antigen (PSA) 10-20 (HCC) 11/02/2021   S/P inguinal hernia repair 08/07/2018   Inguinal hernia 07/16/2018    Past Surgical History:  Procedure Laterality Date   COLONOSCOPY     COLONOSCOPY WITH PROPOFOL  N/A 02/11/2021   Procedure: COLONOSCOPY WITH PROPOFOL ;  Surgeon: Shane Darling, MD;  Location: [facility] ENDOSCOPY;  Service: Endoscopy;  Laterality: N/A;   INGUINAL HERNIA REPAIR Left 07/24/2018   Procedure: HERNIA REPAIR INGUINAL ADULT, LEFT OPEN;  Surgeon: Marshall Skeeter, MD;  Location: [facility] ORS;  Service: General;  Laterality: Left;   ORIF ANKLE FRACTURE Left 05/05/2023   Procedure: OPEN REDUCTION INTERNAL FIXATION (ORIF) ANKLE FRACTURE, DELTOID LIGAMENT REPAIR;  Surgeon: Pink Bridges, DPM;  Location: [facility] ORS;  Service: Orthopedics/Podiatry;  Laterality: Left;    Prior to Admission medications   Medication Sig Start Date End Date Taking? Authorizing Provider  acetaminophen  (TYLENOL ) 325 MG tablet Take 2 tablets (650 mg total) by mouth every 6 (six) hours as needed for up  to 15 days for mild pain (pain score 1-3) (or Fever >/= 101). 05/06/23 05/21/23  Ree Candy, MD  allopurinol  (ZYLOPRIM ) 300 MG tablet Take 1 tablet (300 mg total) by mouth daily. 02/09/22   Marcina Severe, PA-C  amLODipine  (NORVASC ) 10 MG tablet Take 1 tablet (10 mg total) by mouth daily. 12/20/22   Marcina Severe, PA-C  aspirin  EC 81 MG tablet Take 1 tablet (81 mg total) by mouth 2 (two) times daily for 15 days. Swallow whole. 05/06/23 05/21/23  Ree Candy, MD  bisoprolol  (ZEBETA ) 10 MG tablet Take 1 tablet (10 mg total) by mouth daily. 08/28/22   Marcina Severe, PA-C  metFORMIN  (GLUCOPHAGE ) 500 MG tablet Take 1 tablet (500 mg total) by mouth 2 (two) times daily with a meal. 12/04/22   Marcina Severe, PA-C  sildenafil (VIAGRA) 25 MG tablet Take 25 mg by mouth daily as needed for erectile dysfunction. Prostate removed    [provider]    Allergies Patient has no known allergies.  Family History  Problem Relation Age of Onset   Diabetes Mother    Cerebrovascular Accident Mother    Prostate cancer Father    Hypertension Father    Diabetes Brother     Social History Social History   Tobacco Use   Smoking status: Some Days    Types: Cigars    Passive exposure: Current   Smokeless tobacco: Never  Vaping Use   Vaping status: Never Used  Substance Use Topics   Alcohol use: Yes    Alcohol/week: 10.0 standard drinks of alcohol    Types: 7 Cans of beer, 3 Shots of liquor per week    Comment: 4-5 beers a weekend   Drug use: Never    Review of Systems Constitutional: No fever/chills Eyes: No visual changes. ENT: No sore throat. Cardiovascular: Denies chest pain. Respiratory: Denies shortness of breath. Gastrointestinal: No abdominal pain.  No nausea, no vomiting.  No diarrhea.  No constipation. Genitourinary: Negative for dysuria. Musculoskeletal: Negative for back pain. Skin: Negative for rash. Neurological: Negative for headaches, focal weakness or  numbness. Endocrine: Diabetes, hyperlipidemia, and hypertension.  ____________________________________________   PHYSICAL EXAM:  VITAL SIGNS: BP 127/78  Cuff Size Large  Pulse Rate 78  Weight 193 lb (87.5 kg)  Height 5\' 8"  (1.727 m)  Resp 14  SpO2 98 %   Constitutional: Alert and oriented. Well appearing and in no acute distress. Eyes: Conjunctivae are normal. PERRL. EOMI. Head: Atraumatic. Nose: No congestion/rhinnorhea. Mouth/Throat: Mucous membranes are moist.  Oropharynx non-erythematous. Neck: No stridor.  No cervical spine tenderness to palpation. Hematological/Lymphatic/Immunilogical: No cervical lymphadenopathy. Cardiovascular: Normal rate, regular rhythm. Grossly normal heart sounds.  Good peripheral circulation. Respiratory: Normal respiratory effort.  No retractions. Lungs CTAB. Gastrointestinal: Soft and nontender. No distention. No abdominal bruits. No CVA tenderness. Genitourinary: Deferred Musculoskeletal: No lower extremity tenderness nor edema.  No joint effusions. Neurologic:  Normal speech and language. No gross focal neurologic deficits are appreciated. No gait instability. Skin:  Skin is warm, dry and intact. No rash noted. Psychiatric: Mood and affect are normal. Speech and behavior are normal.  ____________________________________________   LABS           Component Ref Range & Units (hover) 7 d ago (05/10/23) 1 yr ago (05/03/22) 1 yr ago (06/07/21) 2 yr ago (02/21/21) 3 yr ago (01/21/20) 4 yr ago (01/16/19) 4 yr ago (10/15/18)  Color, UA Yellow yellow yellow honey Yellow Yellow Yellow  Clarity, UA Clear clear clear clear Clear Clear Clear  Glucose, UA Negative Negative Negative Negative Negative Negative Negative  Bilirubin, UA Positive neg negative negative Negative Negative Negative  Comment: 1+  Ketones, UA Negative neg negative negative Negative Negative Negative  Spec Grav, UA 1.020 1.010 1.010 >=1.030 Abnormal  1.015 1.025 1.015  Blood, UA  Negative neg negative negative Negative Negative Negative  pH, UA 6.0 6.0 6.0 6.0 6.0 6.0 6.0  Protein, UA Negative Negative Negative Negative Negative Negative Negative  Urobilinogen, UA 0.2 0.2 0.2 0.2 0.2 0.2 0.2  Nitrite, UA Negative neg negative negative Negative Negative Negative  Leukocytes, UA Negative Negative Negative Negative Negative Negative Negative  Appearance  dark  dark     Odor                      Component Ref Range & Units (hover) 7 d ago (05/10/23) 13 d ago (05/04/23) 1 yr ago (05/03/22) 1 yr ago (06/07/21) 2 yr ago (02/21/21) 2 yr ago (07/14/20) 3 yr ago (01/21/20)  Hgb A1c MFr Bld 7.6 High  7.1 High  CM 7.1 High  CM 6.7 High  CM 7.0 High  CM 6.6 High  CM 6.8 High  CM  Comment:          Prediabetes: 5.7 - 6.4          Diabetes: >6.4          Glycemic  control for adults with diabetes: <7.0                      Component Ref Range & Units (hover) 7 d ago 1 yr ago 2 yr ago 3 yr ago 4 yr ago  Creatinine, Urine 253.7 41.2 176.3 43.3 118.1  Microalbumin, Urine 6.0 <3.0 16.4 <3.0 CM <3.0 CM  Microalb/Creat Ratio 2 <7 CM 9 CM <7 CM <3 CM  Comment:                        Normal:                0 -  29                        Moderately increased: 30 - 300                        Severely increased:       >300                      Component Ref Range & Units (hover) 7 d ago (05/10/23) 13 d ago (05/04/23) 13 d ago (05/04/23) 1 yr ago (05/03/22) 1 yr ago (10/04/21) 1 yr ago (09/22/21) 1 yr ago (06/09/21)  Glucose 176 High  208 High  CM  159 High      Uric Acid 8.3   8.7 High  CM     Comment:            Therapeutic target for gout patients: <6.0  BUN 13 15 R  12     Creatinine, Ser 1.15 1.14 R  1.25     eGFR 72   65     BUN/Creatinine Ratio 11   10     Sodium 137 138 R  140     Potassium 4.4 3.6 R  4.8     Chloride 99 102 R  102     Calcium  9.2 8.8 Low  R  9.2     Phosphorus 4.7 High    4.5 High      Total Protein 7.3   7.2     Albumin 4.5   4.7     Globulin,  Total 2.8   2.5     Bilirubin Total 0.8   0.7     Alkaline Phosphatase 65   70     LDH 200   190     AST 16   20     ALT 17   23     GGT 20   23     Iron 72   87     Cholesterol, Total 174   194     Triglycerides 94   113     HDL 35 Low    41     VLDL Cholesterol Cal 17   21     LDL Chol Calc (NIH) 122 High    132 High      Chol/HDL Ratio 5.0   4.7 CM                  TSH 1.550   1.550     T4, Total 7.3   5.2     T3 Uptake Ratio 33   28     Free Thyroxine Index 2.4   1.5     Prostate  Specific Ag, Serum <0.1   6.3 High  CM 4.9 High  CM 4.9 High  CM 5.8 High  CM  Comment: Roche ECLIA methodology. According to the American Urological Association, Serum PSA should decrease and remain at undetectable levels after radical prostatectomy. The AUA defines biochemical recurrence as an initial PSA value 0.2 ng/mL or greater followed by a subsequent confirmatory PSA value 0.2 ng/mL or greater. Values obtained with different assay methods or kits cannot be used interchangeably. Results cannot be interpreted as absolute evidence of the presence or absence of malignant disease.  WBC 5.5  10.4 R 4.7     RBC 4.26  4.11 Low  R 4.45     Hemoglobin 13.8  13.4 R 14.2     Hematocrit 39.8  38.7 Low  R 42.1     MCV 93  94.2 R 95     MCH 32.4  32.6 R 31.9     MCHC 34.7  34.6 R 33.7     RDW 12.6  12.9 R 13.6     Platelets 310  220 R 218     Neutrophils 61  78 R 43     Lymphs 31   48     Monocytes 6   7     Eos 1   2     Basos 1   0     Neutrophils Absolute 3.3  8.0 High  R 2.0     Lymphocytes Absolute 1.7  1.7 R 2.3     Monocytes Absolute 0.3   0.4     EOS (ABSOLUTE) 0.1   0.1     Basophils Absolute 0.0  0.0 R 0.0     Immature Granulocytes 0  0 R 0     Immature Grans (Abs) 0.0   0.0                     ____________________________________________  EKG  Normal sinus rhythm 82  bpm ____________________________________________    ____________________________________________   INITIAL IMPRESSION / ASSESSMENT AND PLAN / As part of my medical decision making, I reviewed the following data within the electronic MEDICAL RECORD NUMBER        No acute findings on physical exam and EKG.  Labs show increase of hemoglobin A1c to 7.6 from 7.1.  Will increase metformin  to 1000 mg twice daily.  Advised follow-up hemoglobin A1c in 3 months.     ____________________________________________   FINAL CLINICAL IMPRESSION Annual exam   ED Discharge Orders     None        Note:  This document was prepared using Dragon voice recognition software and may include unintentional dictation errors.

## 2023-06-06 DIAGNOSIS — S82842D Displaced bimalleolar fracture of left lower leg, subsequent encounter for closed fracture with routine healing: Secondary | ICD-10-CM | POA: Diagnosis not present

## 2023-06-22 DIAGNOSIS — Z9889 Other specified postprocedural states: Secondary | ICD-10-CM | POA: Diagnosis not present

## 2023-06-22 DIAGNOSIS — M25572 Pain in left ankle and joints of left foot: Secondary | ICD-10-CM | POA: Diagnosis not present

## 2023-06-22 DIAGNOSIS — Z8781 Personal history of (healed) traumatic fracture: Secondary | ICD-10-CM | POA: Diagnosis not present

## 2023-06-27 DIAGNOSIS — M25572 Pain in left ankle and joints of left foot: Secondary | ICD-10-CM | POA: Diagnosis not present

## 2023-06-27 DIAGNOSIS — Z8781 Personal history of (healed) traumatic fracture: Secondary | ICD-10-CM | POA: Diagnosis not present

## 2023-06-27 DIAGNOSIS — Z9889 Other specified postprocedural states: Secondary | ICD-10-CM | POA: Diagnosis not present

## 2023-07-05 DIAGNOSIS — Z9889 Other specified postprocedural states: Secondary | ICD-10-CM | POA: Diagnosis not present

## 2023-07-05 DIAGNOSIS — Z8781 Personal history of (healed) traumatic fracture: Secondary | ICD-10-CM | POA: Diagnosis not present

## 2023-07-05 DIAGNOSIS — M25572 Pain in left ankle and joints of left foot: Secondary | ICD-10-CM | POA: Diagnosis not present

## 2023-07-18 DIAGNOSIS — S82842D Displaced bimalleolar fracture of left lower leg, subsequent encounter for closed fracture with routine healing: Secondary | ICD-10-CM | POA: Diagnosis not present

## 2023-07-18 DIAGNOSIS — Z9889 Other specified postprocedural states: Secondary | ICD-10-CM | POA: Diagnosis not present

## 2023-07-18 DIAGNOSIS — Z8781 Personal history of (healed) traumatic fracture: Secondary | ICD-10-CM | POA: Diagnosis not present

## 2023-07-18 DIAGNOSIS — M25572 Pain in left ankle and joints of left foot: Secondary | ICD-10-CM | POA: Diagnosis not present

## 2023-07-25 DIAGNOSIS — Z9889 Other specified postprocedural states: Secondary | ICD-10-CM | POA: Diagnosis not present

## 2023-07-25 DIAGNOSIS — Z8781 Personal history of (healed) traumatic fracture: Secondary | ICD-10-CM | POA: Diagnosis not present

## 2023-07-25 DIAGNOSIS — M25672 Stiffness of left ankle, not elsewhere classified: Secondary | ICD-10-CM | POA: Diagnosis not present

## 2023-07-30 DIAGNOSIS — M25672 Stiffness of left ankle, not elsewhere classified: Secondary | ICD-10-CM | POA: Diagnosis not present

## 2023-07-30 DIAGNOSIS — Z8781 Personal history of (healed) traumatic fracture: Secondary | ICD-10-CM | POA: Diagnosis not present

## 2023-07-30 DIAGNOSIS — Z9889 Other specified postprocedural states: Secondary | ICD-10-CM | POA: Diagnosis not present

## 2023-08-01 DIAGNOSIS — Z8781 Personal history of (healed) traumatic fracture: Secondary | ICD-10-CM | POA: Diagnosis not present

## 2023-08-01 DIAGNOSIS — Z9889 Other specified postprocedural states: Secondary | ICD-10-CM | POA: Diagnosis not present

## 2023-08-01 DIAGNOSIS — M25672 Stiffness of left ankle, not elsewhere classified: Secondary | ICD-10-CM | POA: Diagnosis not present

## 2023-08-08 DIAGNOSIS — Z8781 Personal history of (healed) traumatic fracture: Secondary | ICD-10-CM | POA: Diagnosis not present

## 2023-08-08 DIAGNOSIS — M25672 Stiffness of left ankle, not elsewhere classified: Secondary | ICD-10-CM | POA: Diagnosis not present

## 2023-08-08 DIAGNOSIS — Z9889 Other specified postprocedural states: Secondary | ICD-10-CM | POA: Diagnosis not present

## 2023-08-14 DIAGNOSIS — Z9889 Other specified postprocedural states: Secondary | ICD-10-CM | POA: Diagnosis not present

## 2023-08-14 DIAGNOSIS — Z8781 Personal history of (healed) traumatic fracture: Secondary | ICD-10-CM | POA: Diagnosis not present

## 2023-08-14 DIAGNOSIS — M25672 Stiffness of left ankle, not elsewhere classified: Secondary | ICD-10-CM | POA: Diagnosis not present

## 2023-08-16 DIAGNOSIS — Z8781 Personal history of (healed) traumatic fracture: Secondary | ICD-10-CM | POA: Diagnosis not present

## 2023-08-16 DIAGNOSIS — M25672 Stiffness of left ankle, not elsewhere classified: Secondary | ICD-10-CM | POA: Diagnosis not present

## 2023-08-16 DIAGNOSIS — Z9889 Other specified postprocedural states: Secondary | ICD-10-CM | POA: Diagnosis not present

## 2023-08-20 DIAGNOSIS — S82842D Displaced bimalleolar fracture of left lower leg, subsequent encounter for closed fracture with routine healing: Secondary | ICD-10-CM | POA: Diagnosis not present

## 2023-08-20 DIAGNOSIS — E119 Type 2 diabetes mellitus without complications: Secondary | ICD-10-CM | POA: Diagnosis not present

## 2023-09-19 ENCOUNTER — Other Ambulatory Visit: Payer: Self-pay

## 2023-09-19 DIAGNOSIS — M1A9XX0 Chronic gout, unspecified, without tophus (tophi): Secondary | ICD-10-CM

## 2023-09-19 MED ORDER — ALLOPURINOL 300 MG PO TABS: 300.0000 mg | ORAL_TABLET | Freq: Every day | ORAL | 3 refills | Status: AC

## 2023-10-04 ENCOUNTER — Other Ambulatory Visit: Payer: Self-pay

## 2023-10-04 DIAGNOSIS — I10 Essential (primary) hypertension: Secondary | ICD-10-CM

## 2023-10-04 MED ORDER — BISOPROLOL FUMARATE 10 MG PO TABS: 10.0000 mg | ORAL_TABLET | Freq: Every day | ORAL | 3 refills | Status: AC

## 2023-10-19 ENCOUNTER — Other Ambulatory Visit: Payer: Self-pay

## 2023-10-19 DIAGNOSIS — N5231 Erectile dysfunction following radical prostatectomy: Secondary | ICD-10-CM

## 2023-10-19 MED ORDER — TADALAFIL 10 MG PO TABS: 10.0000 mg | ORAL_TABLET | ORAL | 1 refills | Status: DC | PRN

## 2023-11-23 ENCOUNTER — Other Ambulatory Visit: Payer: Self-pay

## 2023-12-20 ENCOUNTER — Other Ambulatory Visit: Payer: Self-pay

## 2023-12-20 DIAGNOSIS — N5231 Erectile dysfunction following radical prostatectomy: Secondary | ICD-10-CM

## 2023-12-20 MED ORDER — TADALAFIL 10 MG PO TABS: 10.0000 mg | ORAL_TABLET | ORAL | 11 refills | Status: AC | PRN

## 2024-01-02 IMAGING — CR DG CHEST 2V
1 series · 2 of 2 positions shown · non-contrast
Comparison: None.

CLINICAL DATA: Productive cough

EXAM:
CHEST - 2 VIEW

[Series 1: dg chest 2 view · 0.14mm/px · 2 of 2 slices shown]
[im 1/2]
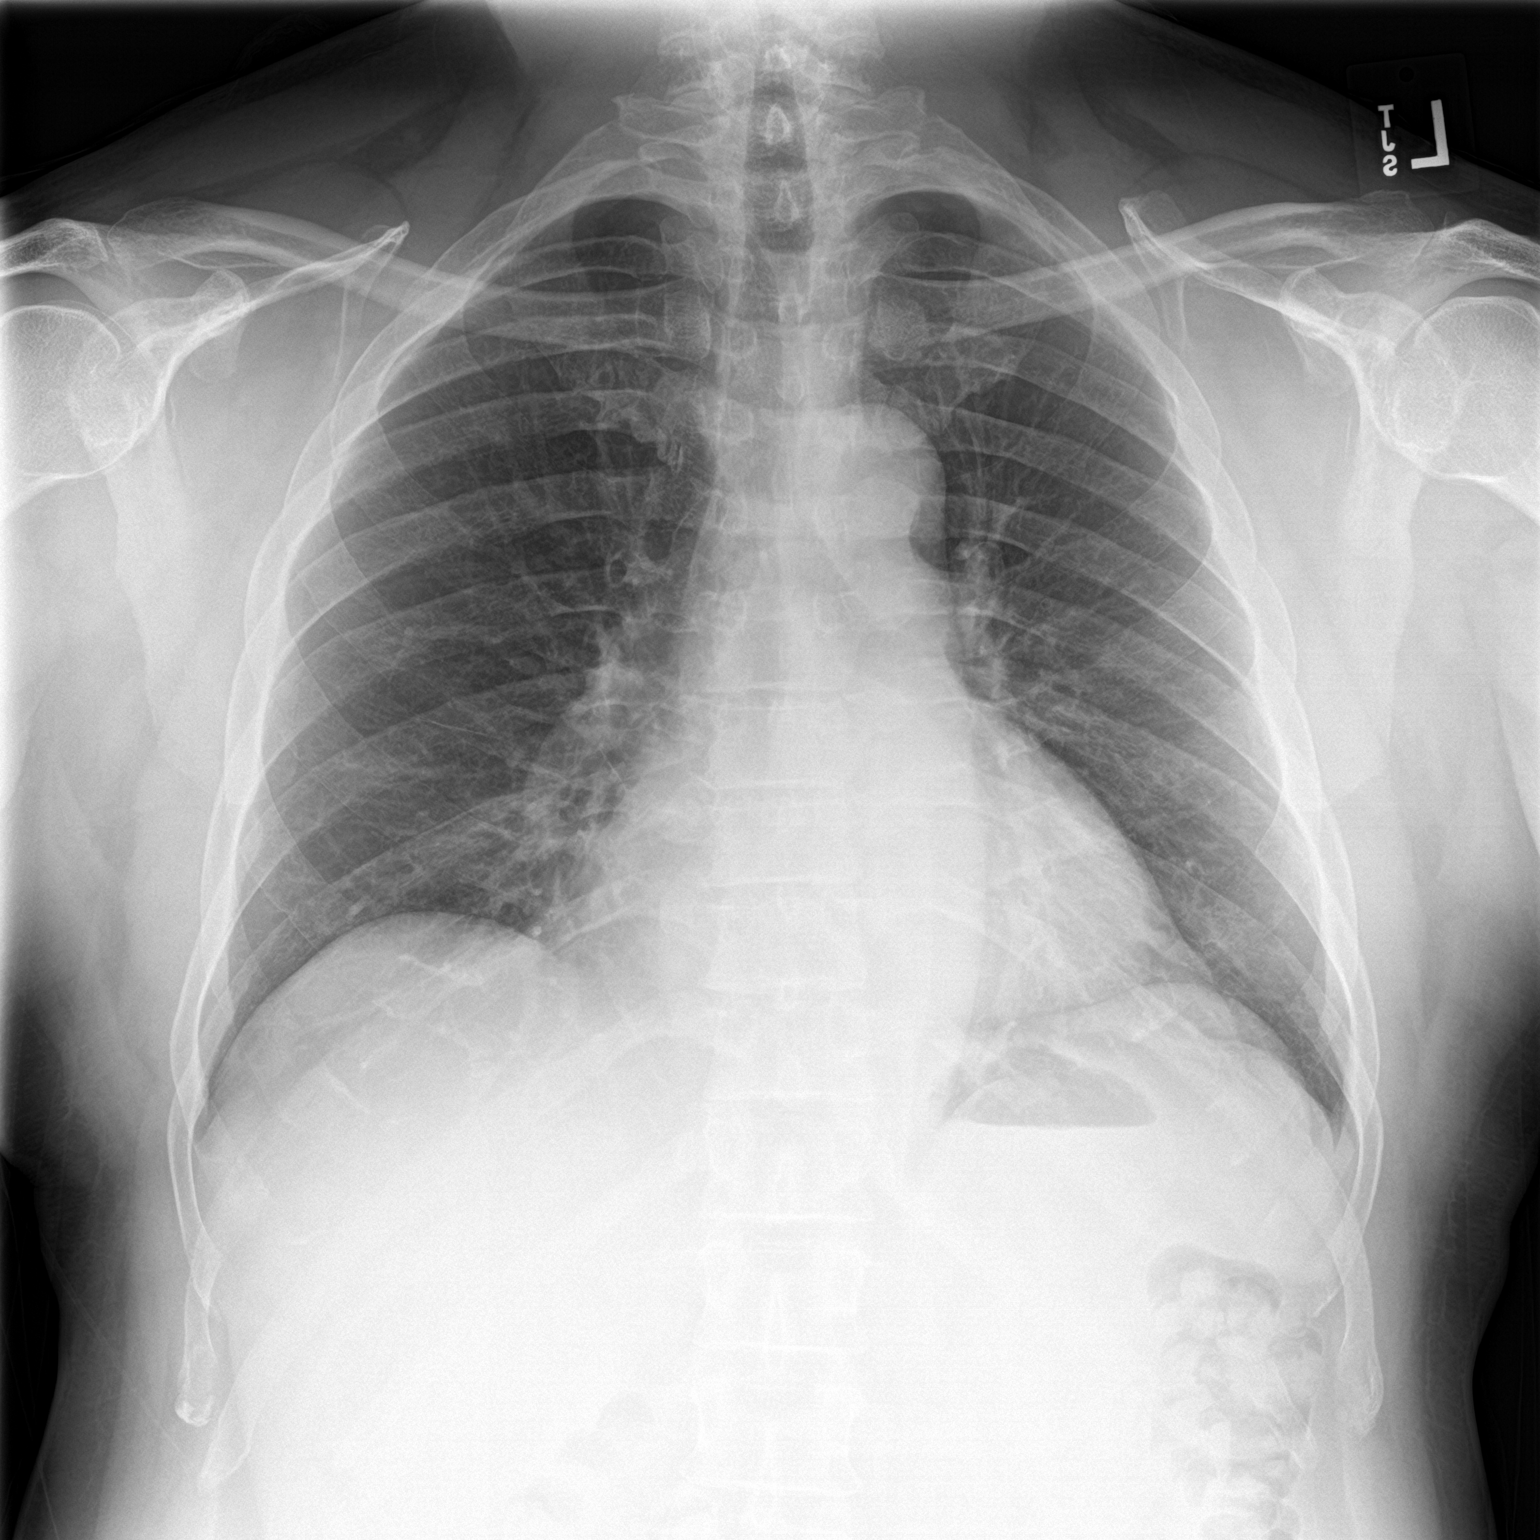
[im 2/2]
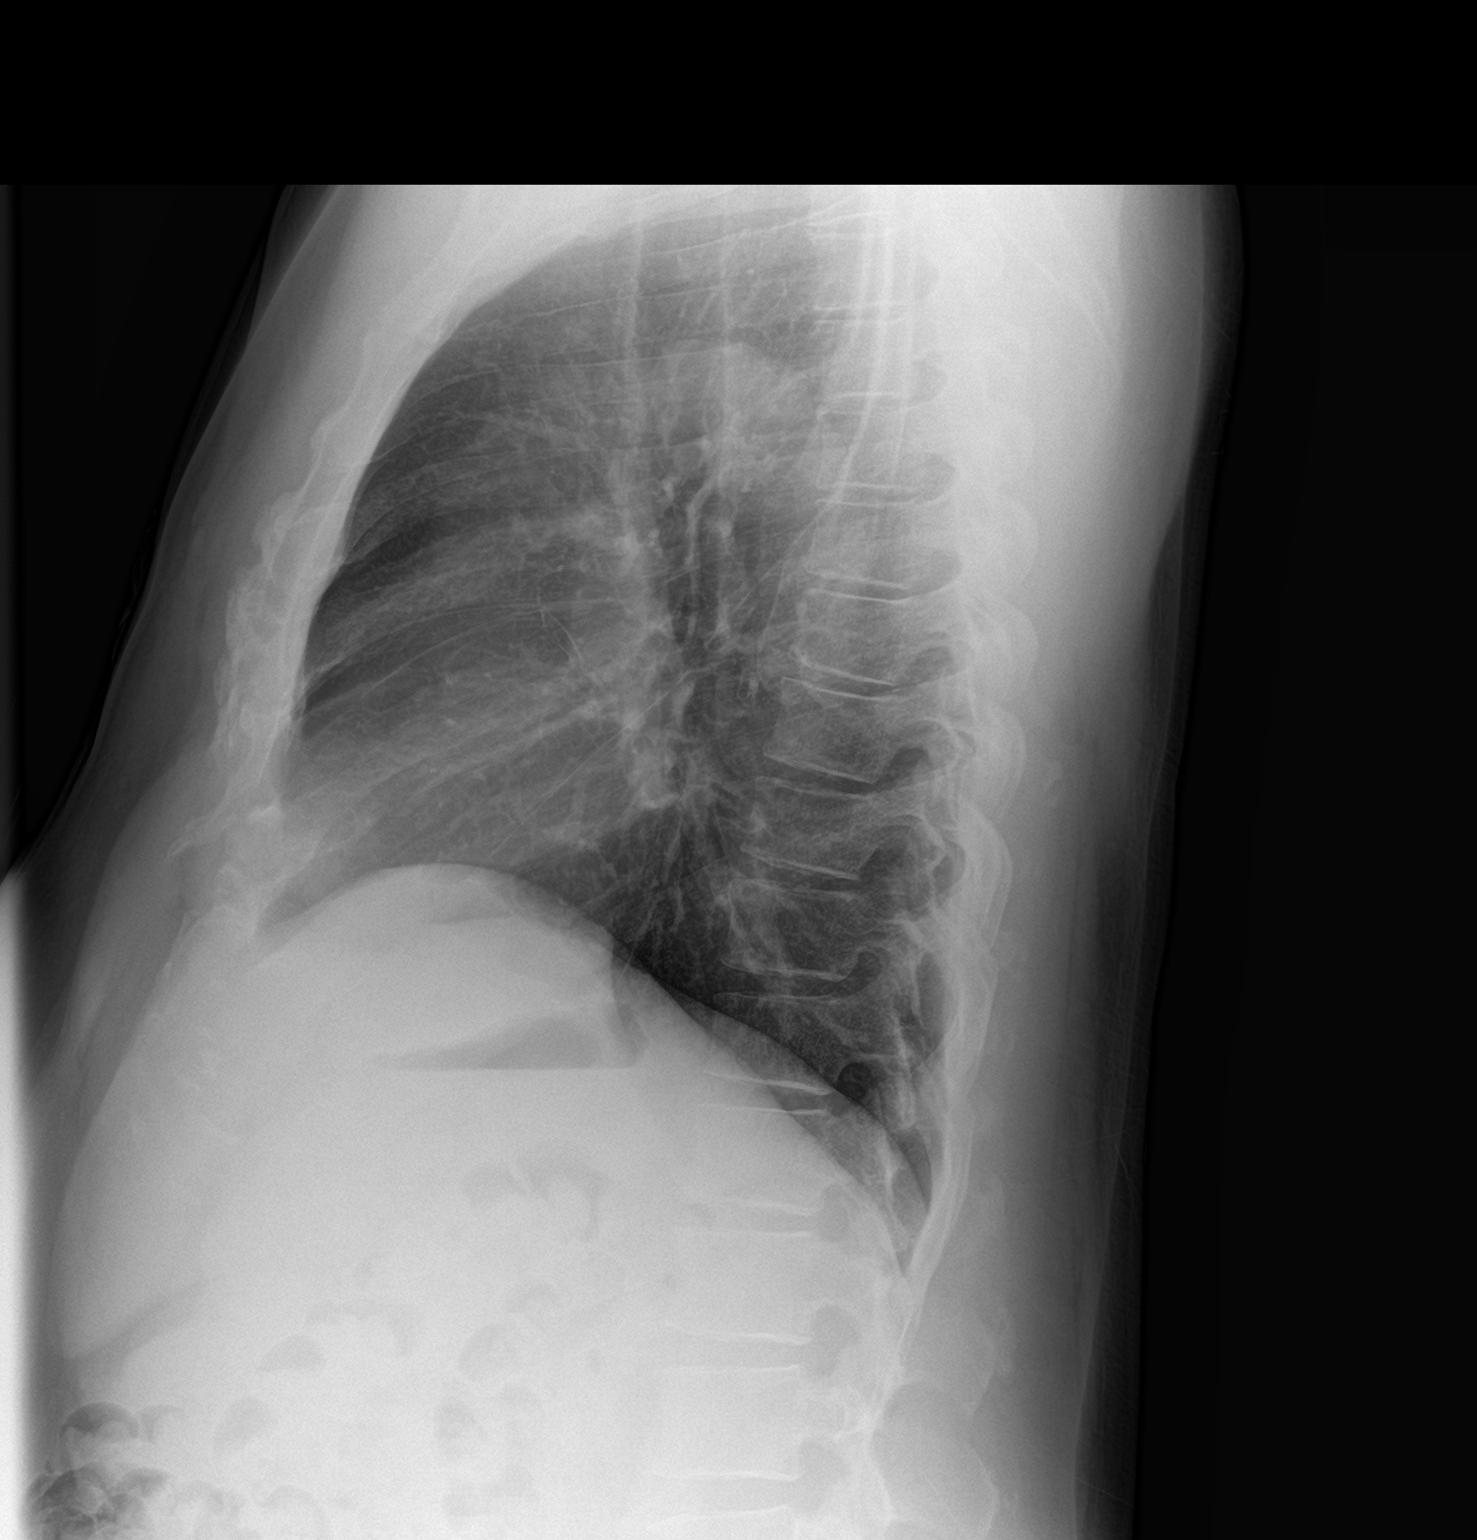

[2 of 2 positions shown; findings below may reference images not displayed]

FINDINGS: Heart and mediastinal contours are within normal limits. No focal
opacities or effusions. No acute bony abnormality.
IMPRESSION: No active cardiopulmonary disease.

## 2024-01-04 ENCOUNTER — Ambulatory Visit
Admission: EM | Admit: 2024-01-04 | Discharge: 2024-01-04 | Disposition: A | Discharge status: Home / Self Care | Attending: Emergency Medicine | Admitting: Emergency Medicine

## 2024-01-04 DIAGNOSIS — J0100 Acute maxillary sinusitis, unspecified: Secondary | ICD-10-CM

## 2024-01-04 MED ORDER — AMOXICILLIN-POT CLAVULANATE 875-125 MG PO TABS: 1.0000 | ORAL_TABLET | Freq: Two times a day (BID) | ORAL | 0 refills | Status: AC

## 2024-01-04 NOTE — ED Provider Notes (Signed)
 " Tom Edwards    CSN: [id number] Arrival date & time: 01/04/24  1024      History   Chief Complaint Chief Complaint  Patient presents with   Cough   Facial Pain    HPI Tom Edwards is a 63 y.o. male.  Patient presents with 1 week history of sinus congestion, sinus pressure, postnasal drainage, runny nose, sneezing, cough.  Various OTC treatments attempted without relief.  No fever or shortness of breath.  His medical history includes hypertension, diabetes, sleep apnea.  The history is provided by the patient and medical records.    Past Medical History:  Diagnosis Date   Acute medial meniscus tear    DM (diabetes mellitus), type 2 (HCC)    Gout    Hyperlipemia    Hypertension    Over weight    Sedentary lifestyle    Sleep apnea    no cpap   Vertigo    Vertigo     Patient Active Problem List   Diagnosis Date Noted   Closed left ankle fracture, initial encounter 05/04/2023   Type II diabetes mellitus (HCC) 05/04/2023   Sleep apnea    Cancer of prostate with intermediate recurrence risk, stage T2B-C or Gleason 7 or prostate-specific antigen (PSA) 10-20 (HCC) 11/02/2021   S/P inguinal hernia repair 08/07/2018   Inguinal hernia 07/16/2018    Past Surgical History:  Procedure Laterality Date   COLONOSCOPY     COLONOSCOPY WITH PROPOFOL  N/A 02/11/2021   Procedure: COLONOSCOPY WITH PROPOFOL ;  Surgeon: Maryruth Ole DASEN, MD;  Location: [facility];  Service: Endoscopy;  Laterality: N/A;   INGUINAL HERNIA REPAIR Left 07/24/2018   Procedure: HERNIA REPAIR INGUINAL ADULT, LEFT OPEN;  Surgeon: Dessa Reyes ORN, MD;  Location: [facility];  Service: General;  Laterality: Left;   ORIF ANKLE FRACTURE Left 05/05/2023   Procedure: OPEN REDUCTION INTERNAL FIXATION (ORIF) ANKLE FRACTURE, DELTOID LIGAMENT REPAIR;  Surgeon: Lennie Barter, DPM;  Location: [facility];  Service: Orthopedics/Podiatry;  Laterality: Left;       Home Medications    Prior to Admission  medications  Medication Sig Start Date End Date Taking? Authorizing Provider  allopurinol  (ZYLOPRIM ) 300 MG tablet Take 1 tablet (300 mg total) by mouth daily. 09/19/23  Yes Claudene Tanda POUR, PA-C  amLODipine  (NORVASC ) 10 MG tablet Take 1 tablet (10 mg total) by mouth daily. 12/20/22  Yes Claudene Tanda POUR, PA-C  amoxicillin -clavulanate (AUGMENTIN ) 875-125 MG tablet Take 1 tablet by mouth every 12 (twelve) hours. 01/04/24  Yes Corlis Burnard DEL, NP  bisoprolol  (ZEBETA ) 10 MG tablet Take 1 tablet (10 mg total) by mouth daily. 10/04/23  Yes Claudene Tanda POUR, PA-C  metFORMIN  (GLUCOPHAGE ) 1000 MG tablet Take 1 tablet (1,000 mg total) by mouth 2 (two) times daily with a meal. 05/17/23  Yes Claudene Tanda POUR, PA-C  tadalafil  (CIALIS ) 10 MG tablet Take 1 tablet (10 mg total) by mouth every other day as needed for erectile dysfunction. 12/20/23  Yes Claudene Tanda POUR, PA-C    Family History Family History  Problem Relation Age of Onset   Diabetes Mother    Cerebrovascular Accident Mother    Prostate cancer Father    Hypertension Father    Diabetes Brother     Social History Social History[1]   Allergies   Patient has no known allergies.   Review of Systems Review of Systems  Constitutional:  Negative for chills and fever.  HENT:  Positive for congestion, postnasal drip, rhinorrhea and sinus  pressure. Negative for ear pain and sore throat.   Respiratory:  Positive for cough. Negative for shortness of breath.      Physical Exam Triage Vital Signs ED Triage Vitals  Encounter Vitals Group     BP 01/04/24 1157 131/85     Girls Systolic BP Percentile --      Girls Diastolic BP Percentile --      Boys Systolic BP Percentile --      Boys Diastolic BP Percentile --      Pulse Rate 01/04/24 1157 82     Resp 01/04/24 1157 16     Temp 01/04/24 1157 98.4 F (36.9 C)     Temp Source 01/04/24 1157 Oral     SpO2 01/04/24 1157 95 %     Weight --      Height --      Head Circumference --      Peak Flow  --      Pain Score 01/04/24 1155 0     Pain Loc --      Pain Education --      Exclude from Growth Chart --    No data found.  Updated Vital Signs BP 131/85 (BP Location: Right Arm)   Pulse 82   Temp 98.4 F (36.9 C) (Oral)   Resp 16   SpO2 95%   Visual Acuity Right Eye Distance:   Left Eye Distance:   Bilateral Distance:    Right Eye Near:   Left Eye Near:    Bilateral Near:     Physical Exam Constitutional:      General: He is not in acute distress. HENT:     Right Ear: Tympanic membrane normal.     Left Ear: Tympanic membrane normal.     Nose: Congestion present.     Mouth/Throat:     Mouth: Mucous membranes are moist.     Pharynx: Oropharynx is clear.  Cardiovascular:     Rate and Rhythm: Normal rate and regular rhythm.     Heart sounds: Normal heart sounds.  Pulmonary:     Effort: Pulmonary effort is normal. No respiratory distress.     Breath sounds: Normal breath sounds.  Neurological:     Mental Status: He is alert.      UC Treatments / Results  Labs (all labs ordered are listed, but only abnormal results are displayed) Labs Reviewed - No data to display  EKG   Radiology No results found.  Procedures Procedures (including critical care time)  Medications Ordered in UC Medications - No data to display  Initial Impression / Assessment and Plan / UC Course  I have reviewed the triage vital signs and the nursing notes.  Pertinent labs & imaging results that were available during my care of the patient were reviewed by me and considered in my medical decision making (see chart for details).    Acute sinusitis.  Afebrile and vital signs are stable.  Patient has been symptomatic for a week and is not improving with OTC treatment.  Treating today with Augmentin .  Tylenol  or ibuprofen as needed, plain Mucinex as needed.  Instructed him to follow-up with his PCP.  ED precautions given.  Education provided on sinus infection.  He agrees to plan of  care.  Final Clinical Impressions(s) / UC Diagnoses   Final diagnoses:  Acute non-recurrent maxillary sinusitis     Discharge Instructions      Take the Augmentin  as directed.  Follow up with your  primary care provider.  Go to the emergency department if you have worsening symptoms.        ED Prescriptions     Medication Sig Dispense Auth. Provider   amoxicillin -clavulanate (AUGMENTIN ) 875-125 MG tablet Take 1 tablet by mouth every 12 (twelve) hours. 14 tablet Corlis Burnard DEL, NP      PDMP not reviewed this encounter.    [1]  Social History Tobacco Use   Smoking status: Some Days    Types: Cigars    Passive exposure: Current   Smokeless tobacco: Never  Vaping Use   Vaping status: Never Used  Substance Use Topics   Alcohol use: Yes    Alcohol/week: 10.0 standard drinks of alcohol    Types: 7 Cans of beer, 3 Shots of liquor per week    Comment: 4-5 beers a weekend   Drug use: Never     Corlis Burnard DEL, NP 01/04/24 1247  "

## 2024-01-04 NOTE — ED Triage Notes (Signed)
 Cough, sneezing, congestion, sinus pain and pressure, headache x 1 week. Taking mucinex, coricidin, otc cough syrup with no relief symptoms.

## 2024-01-04 NOTE — Discharge Instructions (Addendum)
 Take the Augmentin  as directed.  Follow up with your primary care provider.  Go to the emergency department if you have worsening symptoms.
